# Patient Record
Sex: Female | Born: 1980 | Race: White | Hispanic: No | Marital: Married | Smoking: Never smoker
Health system: Southern US, Community
[De-identification: ages and names within clinical notes are randomized; demographics above are authoritative.]

## PROBLEM LIST (undated history)

## (undated) DIAGNOSIS — R51 Headache: Secondary | ICD-10-CM

## (undated) DIAGNOSIS — K219 Gastro-esophageal reflux disease without esophagitis: Secondary | ICD-10-CM

## (undated) DIAGNOSIS — D649 Anemia, unspecified: Secondary | ICD-10-CM

## (undated) DIAGNOSIS — R519 Headache, unspecified: Secondary | ICD-10-CM

## (undated) HISTORY — PX: NO PAST SURGERIES: SHX2092

---
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
## 2003-08-05 ENCOUNTER — Other Ambulatory Visit: Admission: RE | Admit: 2003-08-05 | Discharge: 2003-08-05 | Payer: Self-pay | Admitting: Obstetrics and Gynecology

## 2003-09-24 ENCOUNTER — Ambulatory Visit (HOSPITAL_COMMUNITY): Admission: RE | Admit: 2003-09-24 | Discharge: 2003-09-24 | Payer: Self-pay | Admitting: Obstetrics and Gynecology

## 2003-09-24 ENCOUNTER — Encounter: Payer: Self-pay | Admitting: Obstetrics and Gynecology

## 2003-12-04 ENCOUNTER — Inpatient Hospital Stay (HOSPITAL_COMMUNITY): Admission: AD | Admit: 2003-12-04 | Discharge: 2003-12-05 | Payer: Self-pay | Admitting: Obstetrics and Gynecology

## 2004-02-09 ENCOUNTER — Inpatient Hospital Stay (HOSPITAL_COMMUNITY): Admission: AD | Admit: 2004-02-09 | Discharge: 2004-02-09 | Payer: Self-pay | Admitting: Obstetrics and Gynecology

## 2004-02-18 ENCOUNTER — Inpatient Hospital Stay (HOSPITAL_COMMUNITY): Admission: AD | Admit: 2004-02-18 | Discharge: 2004-02-21 | Payer: Self-pay | Admitting: Obstetrics and Gynecology

## 2004-11-27 ENCOUNTER — Emergency Department: Payer: Self-pay | Admitting: Emergency Medicine

## 2005-01-07 ENCOUNTER — Other Ambulatory Visit: Admission: RE | Admit: 2005-01-07 | Discharge: 2005-01-07 | Payer: Self-pay | Admitting: Obstetrics and Gynecology

## 2006-04-26 ENCOUNTER — Other Ambulatory Visit: Admission: RE | Admit: 2006-04-26 | Discharge: 2006-04-26 | Payer: Self-pay | Admitting: Obstetrics and Gynecology

## 2007-01-25 ENCOUNTER — Emergency Department: Payer: Self-pay

## 2007-01-25 ENCOUNTER — Other Ambulatory Visit: Payer: Self-pay

## 2008-03-27 ENCOUNTER — Inpatient Hospital Stay: Payer: Self-pay | Admitting: Unknown Physician Specialty

## 2009-01-22 ENCOUNTER — Inpatient Hospital Stay: Payer: Self-pay | Admitting: Obstetrics and Gynecology

## 2010-03-08 ENCOUNTER — Ambulatory Visit: Payer: Self-pay | Admitting: Internal Medicine

## 2011-10-26 ENCOUNTER — Emergency Department: Payer: Self-pay | Admitting: *Deleted

## 2011-11-07 ENCOUNTER — Ambulatory Visit: Payer: Self-pay

## 2012-11-19 ENCOUNTER — Emergency Department: Payer: Self-pay | Admitting: Internal Medicine

## 2012-11-20 ENCOUNTER — Emergency Department: Payer: Self-pay | Admitting: Emergency Medicine

## 2012-11-20 LAB — BASIC METABOLIC PANEL
Anion Gap: 8 (ref 7–16)
BUN: 5 mg/dL — ABNORMAL LOW (ref 7–18)
Calcium, Total: 8.3 mg/dL — ABNORMAL LOW (ref 8.5–10.1)
Chloride: 107 mmol/L (ref 98–107)
Co2: 24 mmol/L (ref 21–32)
Creatinine: 0.59 mg/dL — ABNORMAL LOW (ref 0.60–1.30)
EGFR (African American): >60
EGFR (Non-African Amer.): >60
Glucose: 109 mg/dL — ABNORMAL HIGH (ref 65–99)
Osmolality: 275 (ref 275–301)
Potassium: 3.2 mmol/L — ABNORMAL LOW (ref 3.5–5.1)
Sodium: 139 mmol/L (ref 136–145)

## 2012-11-20 LAB — CBC
HCT: 33.2 % — ABNORMAL LOW (ref 35.0–47.0)
HGB: 11.5 g/dL — ABNORMAL LOW (ref 12.0–16.0)
MCH: 29.8 pg (ref 26.0–34.0)
MCHC: 34.6 g/dL (ref 32.0–36.0)
MCV: 86 fL (ref 80–100)
Platelet: 257 10*3/uL (ref 150–440)
RBC: 3.85 10*6/uL (ref 3.80–5.20)
RDW: 14.5 % (ref 11.5–14.5)
WBC: 12.7 10*3/uL — ABNORMAL HIGH (ref 3.6–11.0)

## 2015-05-23 ENCOUNTER — Emergency Department
Admission: EM | Admit: 2015-05-23 | Discharge: 2015-05-24 | Disposition: A | Discharge status: Home / Self Care | Payer: BC Managed Care – PPO | Attending: Student | Admitting: Student

## 2015-05-23 ENCOUNTER — Emergency Department: Payer: BC Managed Care – PPO

## 2015-05-23 ENCOUNTER — Encounter: Payer: Self-pay | Admitting: Emergency Medicine

## 2015-05-23 DIAGNOSIS — R1013 Epigastric pain: Secondary | ICD-10-CM | POA: Diagnosis not present

## 2015-05-23 DIAGNOSIS — R0602 Shortness of breath: Secondary | ICD-10-CM | POA: Insufficient documentation

## 2015-05-23 DIAGNOSIS — Z880 Allergy status to penicillin: Secondary | ICD-10-CM | POA: Insufficient documentation

## 2015-05-23 DIAGNOSIS — R079 Chest pain, unspecified: Secondary | ICD-10-CM | POA: Insufficient documentation

## 2015-05-23 DIAGNOSIS — Z3202 Encounter for pregnancy test, result negative: Secondary | ICD-10-CM | POA: Insufficient documentation

## 2015-05-23 LAB — BASIC METABOLIC PANEL
ANION GAP: 8 (ref 5–15)
BUN: 8 mg/dL (ref 6–20)
CALCIUM: 8.9 mg/dL (ref 8.9–10.3)
CO2: 23 mmol/L (ref 22–32)
Chloride: 106 mmol/L (ref 101–111)
Creatinine, Ser: 0.69 mg/dL (ref 0.44–1.00)
GFR calc non Af Amer: >60 mL/min (ref >60)
Glucose, Bld: 100 mg/dL — ABNORMAL HIGH (ref 65–99)
POTASSIUM: 3.9 mmol/L (ref 3.5–5.1)
Sodium: 137 mmol/L (ref 135–145)

## 2015-05-23 LAB — CBC
HCT: 35.1 % (ref 35.0–47.0)
HEMOGLOBIN: 12.2 g/dL (ref 12.0–16.0)
MCH: 29.9 pg (ref 26.0–34.0)
MCHC: 34.6 g/dL (ref 32.0–36.0)
MCV: 86.3 fL (ref 80.0–100.0)
PLATELETS: 254 10*3/uL (ref 150–440)
RBC: 4.07 MIL/uL (ref 3.80–5.20)
RDW: 14.3 % (ref 11.5–14.5)
WBC: 8.3 10*3/uL (ref 3.6–11.0)

## 2015-05-23 LAB — TROPONIN I: Troponin I: <0.03 ng/mL (ref <0.031)

## 2015-05-23 LAB — BRAIN NATRIURETIC PEPTIDE: B Natriuretic Peptide: 17 pg/mL (ref 0.0–100.0)

## 2015-05-23 MED ORDER — MORPHINE SULFATE 4 MG/ML IJ SOLN: 4.0000 mg | Freq: Once | INTRAMUSCULAR | Status: DC

## 2015-05-23 MED ORDER — GI COCKTAIL ~~LOC~~
30.0000 mL | Freq: Once | ORAL | Status: AC
  Administered 2015-05-23: 30 mL via ORAL

## 2015-05-23 MED ORDER — ONDANSETRON HCL 4 MG/2ML IJ SOLN
4.0000 mg | INTRAMUSCULAR | Status: AC
  Administered 2015-05-23: 4 mg via INTRAVENOUS

## 2015-05-23 MED ORDER — MORPHINE SULFATE 2 MG/ML IJ SOLN
INTRAMUSCULAR | Status: AC
  Administered 2015-05-23: 4 mg via INTRAVENOUS
  Filled 2015-05-23: qty 2

## 2015-05-23 MED ORDER — ONDANSETRON HCL 4 MG/2ML IJ SOLN
INTRAMUSCULAR | Status: AC
  Administered 2015-05-23: 4 mg via INTRAVENOUS
  Filled 2015-05-23: qty 2

## 2015-05-23 MED ORDER — GI COCKTAIL ~~LOC~~
ORAL | Status: AC
  Administered 2015-05-23: 30 mL via ORAL
  Filled 2015-05-23: qty 30

## 2015-05-23 NOTE — ED Provider Notes (Signed)
[facility] Emergency Department Provider Note  ____________________________________________  Time seen: Approximately 11:58 PM  I have reviewed the triage vital signs and the nursing notes.   HISTORY  Chief Complaint Chest Pain    HPI Katelyn Ellis is a 34 y.o. female with no chronic medical problems presents for evaluation of 4 days of gradual onset intermittent epigastric pain radiating into the mid chest. Today the pain has been constant since 2:30 PM and has been radiating to the back. When the pain was severe, it was associated with shortness of breath and it was worse with deep inspiration. Pain is not worsened with exertion. Currently pain is 6 out of 10 after morphine. She has had no nausea, no vomiting, no diarrhea, no fevers, no chills. She has a copper IUD and denies pregnancy, no hormone use, no history of DVT, no hemoptysis. She has been taking Nexium since yesterday however this has not seemed to help her pain.   History reviewed. No pertinent past medical history.  There are no active problems to display for this patient.   History reviewed. No pertinent past surgical history.  No current outpatient prescriptions on file.  Allergies Benadryl; Codeine; and Penicillins  No family history on file.  Social History History  Substance Use Topics  . Smoking status: Never Smoker   . Smokeless tobacco: Not on file  . Alcohol Use: No    Review of Systems Constitutional: No fever/chills Eyes: No visual changes. ENT: No sore throat. Cardiovascular: + chest pain. Respiratory: + shortness of breath. Gastrointestinal: +epigastric abdominal pain.  No nausea, no vomiting.  No diarrhea.  No constipation. Genitourinary: Negative for dysuria. Musculoskeletal: Negative for back pain. Skin: Negative for rash. Neurological: Negative for headaches, focal weakness or numbness.  10-point ROS otherwise  negative.  ____________________________________________   PHYSICAL EXAM:  VITAL SIGNS: ED Triage Vitals  Enc Vitals Group     BP 05/23/15 1746 119/70 mmHg     Pulse Rate 05/23/15 1746 71     Resp 05/23/15 1746 18     Temp 05/23/15 1746 98.5 F (36.9 C)     Temp Source 05/23/15 1746 Oral     SpO2 05/23/15 1746 100 %     Weight 05/23/15 1741 150 lb (68.04 kg)     Height 05/23/15 1741 5\' 4"  (1.626 m)     Head Cir --      Peak Flow --      Pain Score 05/23/15 1741 10     Pain Loc --      Pain Edu? --      Excl. in GC? --     Constitutional: Alert and oriented. Well appearing and in no acute distress. Eyes: Conjunctivae are normal. PERRL. EOMI. Head: Atraumatic. Nose: No congestion/rhinnorhea. Mouth/Throat: Mucous membranes are moist.  Oropharynx non-erythematous. Neck: No stridor. Cardiovascular: Normal rate, regular rhythm. Grossly normal heart sounds.  Good peripheral circulation. Respiratory: Normal respiratory effort.  No retractions. Lungs CTAB. Gastrointestinal: Soft and nontender. No distention. No abdominal bruits. No CVA tenderness. Genitourinary: deferred Musculoskeletal: No lower extremity tenderness nor edema.  No joint effusions. Neurologic:  Normal speech and language. No gross focal neurologic deficits are appreciated. Speech is normal. No gait instability. Skin:  Skin is warm, dry and intact. No rash noted. Psychiatric: Mood and affect are normal. Speech and behavior are normal.  ____________________________________________   LABS (all labs ordered are listed, but only abnormal results are displayed)  Labs Reviewed  BASIC METABOLIC PANEL -  Abnormal; Notable for the following:    Glucose, Bld 100 (*)    All other components within normal limits  CBC  BRAIN NATRIURETIC PEPTIDE  TROPONIN I  HEPATIC FUNCTION PANEL  URINALYSIS COMPLETEWITH MICROSCOPIC ([facility] ONLY)  POC URINE PREG, ED   ____________________________________________  EKG  ED ECG  REPORT I, Gayla Doss, the attending physician, personally viewed and interpreted this ECG.   Date: 05/24/2015  EKG Time: 17:45  Rate: 79  Rhythm: normal sinus rhythm, sinus rhythm with short PR interval  Axis: normal  Intervals:none  ST&T Change: No acute ST segment elevation  ____________________________________________  RADIOLOGY  CXR IMPRESSION: No active cardiopulmonary disease. ____________________________________________   PROCEDURES  Procedure(s) performed: None  Critical Care performed: No  ____________________________________________   INITIAL IMPRESSION / ASSESSMENT AND PLAN / ED COURSE  Pertinent labs & imaging results that were available during my care of the patient were reviewed by me and considered in my medical decision making (see chart for details).  Katelyn Ellis is a 34 y.o. female with no chronic medical problems presents for evaluation of 4 days of gradual onset intermittent epigastric pain radiating into the mid chest. On exam, she is very well-appearing and in no acute distress. Vital signs stable, she is afebrile. EKG is reassuring, no evidence of ischemia, troponin negative despite 10 hours of constant pain, doubt ACS. Heart score 1 because of family history of coronary artery disease beginning at age 28. PERC negative and I doubt PE. Not consistent with acute aortic dissection. Suspect GI cause of chest pain such as GERD or gastritis.  ----------------------------------------- 1:31 AM on 05/24/2015 -----------------------------------------  Patient reports significant improvement in pain after GI cocktail which supports possible GERD versus gastritis diagnosis. Labs generally unremarkable. UA with 2+ leuk esterase but no red blood cells, no white blood cells and rare bacteria, nitrite negative. The patient adamantly denies any urinary complaints so will not treat at this time. Urine cultures sent and are pending. Discussed omeprazole and  PCP follow-up. DC with return precautions. ____________________________________________   FINAL CLINICAL IMPRESSION(S) / ED DIAGNOSES  Final diagnoses:  Chest pain, unspecified chest pain type      Gayla Doss, MD 05/24/15 0134

## 2015-05-23 NOTE — ED Notes (Signed)
Epigastric pain that started Monday, radiates to back, progressively worse.  Thought it was indigestion at first, tried Nexium, no relief.  Now pain is worse, hurts to take a breath.  Is currently on an IUD.  No long travel recently.

## 2015-05-23 NOTE — ED Notes (Signed)
Patient reports pain to chest radiating up and down through sternal area and through to back that started 2 days ago. Patient reports that she has been taking Nexium but that this seems to be making pain worse. Patient sitting on side of bed at this time reports pain is severe.

## 2015-05-24 LAB — HEPATIC FUNCTION PANEL
ALT: 10 U/L — ABNORMAL LOW (ref 14–54)
AST: 17 U/L (ref 15–41)
Albumin: 4.1 g/dL (ref 3.5–5.0)
Alkaline Phosphatase: 95 U/L (ref 38–126)
BILIRUBIN TOTAL: 0.4 mg/dL (ref 0.3–1.2)
Bilirubin, Direct: <0.1 mg/dL — ABNORMAL LOW (ref 0.1–0.5)
Total Protein: 7.8 g/dL (ref 6.5–8.1)

## 2015-05-24 LAB — URINALYSIS COMPLETE WITH MICROSCOPIC (ARMC ONLY)
Bilirubin Urine: NEGATIVE
Glucose, UA: NEGATIVE mg/dL
KETONES UR: NEGATIVE mg/dL
Nitrite: NEGATIVE
PH: 6 (ref 5.0–8.0)
Protein, ur: NEGATIVE mg/dL
SPECIFIC GRAVITY, URINE: 1.004 — AB (ref 1.005–1.030)

## 2015-05-24 LAB — POCT PREGNANCY, URINE: PREG TEST UR: NEGATIVE

## 2015-05-24 MED ORDER — OMEPRAZOLE MAGNESIUM 20 MG PO TBEC: 20.0000 mg | DELAYED_RELEASE_TABLET | Freq: Every day | ORAL | Status: DC

## 2015-05-24 NOTE — ED Notes (Signed)
Upreg resulted negative, POC scanner wouldn't recognize pt's MRN

## 2015-05-25 LAB — URINE CULTURE: SPECIAL REQUESTS: NORMAL

## 2015-07-22 ENCOUNTER — Other Ambulatory Visit: Payer: Self-pay | Admitting: Nurse Practitioner

## 2015-07-22 DIAGNOSIS — R1013 Epigastric pain: Secondary | ICD-10-CM

## 2015-07-22 DIAGNOSIS — R110 Nausea: Secondary | ICD-10-CM

## 2015-07-28 ENCOUNTER — Other Ambulatory Visit: Payer: Self-pay | Admitting: Nurse Practitioner

## 2015-07-28 ENCOUNTER — Ambulatory Visit
Admission: RE | Admit: 2015-07-28 | Discharge: 2015-07-28 | Disposition: A | Discharge status: Home / Self Care | Payer: BC Managed Care – PPO | Source: Ambulatory Visit | Attending: Nurse Practitioner | Admitting: Nurse Practitioner

## 2015-07-28 DIAGNOSIS — K8020 Calculus of gallbladder without cholecystitis without obstruction: Secondary | ICD-10-CM | POA: Diagnosis not present

## 2015-07-28 DIAGNOSIS — R1013 Epigastric pain: Secondary | ICD-10-CM | POA: Insufficient documentation

## 2015-07-28 DIAGNOSIS — R110 Nausea: Secondary | ICD-10-CM

## 2015-07-28 MED ORDER — TECHNETIUM TC 99M MEBROFENIN IV KIT
5.6400 | PACK | Freq: Once | INTRAVENOUS | Status: DC | PRN
  Administered 2015-07-28: 5.64 via INTRAVENOUS
  Filled 2015-07-28: qty 6

## 2015-08-11 ENCOUNTER — Other Ambulatory Visit: Payer: BC Managed Care – PPO

## 2015-08-11 ENCOUNTER — Encounter: Payer: Self-pay | Admitting: *Deleted

## 2015-08-11 NOTE — Patient Instructions (Signed)
  Your procedure is scheduled on: 08-12-15 Report to [facility] SAME DAY SURGERY 2ND FLOOR @ 8:45-PT AWARE OF ARRIVAL TIME   Remember: Instructions that are not followed completely may result in serious medical risk, up to and including death, or upon the discretion of your surgeon and anesthesiologist your surgery may need to be rescheduled.    __X__ 1. Do not eat food or drink liquids after midnight. No gum chewing or hard candies.     __X__ 2. No Alcohol for 24 hours before or after surgery.   ____ 3. Bring all medications with you on the day of surgery if instructed.    ____ 4. Notify your doctor if there is any change in your medical condition     (cold, fever, infections).     Do not wear jewelry, make-up, hairpins, clips or nail polish.  Do not wear lotions, powders, or perfumes. You may wear deodorant.  Do not shave 48 hours prior to surgery. Men may shave face and neck.  Do not bring valuables to the hospital.    [facility] is not responsible for any belongings or valuables.               Contacts, dentures or bridgework may not be worn into surgery.  Leave your suitcase in the car. After surgery it may be brought to your room.  For patients admitted to the hospital, discharge time is determined by your  treatment team.   Patients discharged the day of surgery will not be allowed to drive home.   Please read over the following fact sheets that you were given:      ____ Take these medicines the morning of surgery with A SIP OF WATER:    1. TAKE AN EXTRA DOSE OF PRILOSEC TONIGHT  2. PT UNABLE TO SWALLOW PRILOSEC MORNING OF SURGERY WITH SIP OF WATER-PT HAS TO OPEN IT UP AN PUT IN APPLESAUCE-PT TOLD NOT TO DO THIS  3.   4.  5.  6.  ____ Fleet Enema (as directed)   ____ Use CHG Soap as directed  ____ Use inhalers on the day of surgery  ____ Stop metformin 2 days prior to surgery    ____ Take 1/2 of usual insulin dose the night before surgery and none on the  morning of surgery.   ____ Stop Coumadin/Plavix/aspirin-N/A  ____ Stop Anti-inflammatories-NO NSAIDS OR ASA PRODUCTS-TYLENOL OK   ____ Stop supplements until after surgery.    ____ Bring C-Pap to the hospital.

## 2015-08-12 ENCOUNTER — Ambulatory Visit
Admission: RE | Admit: 2015-08-12 | Discharge: 2015-08-12 | Disposition: A | Discharge status: Home / Self Care | Payer: BC Managed Care – PPO | Source: Ambulatory Visit | Attending: Surgery | Admitting: Surgery

## 2015-08-12 ENCOUNTER — Ambulatory Visit: Payer: BC Managed Care – PPO | Admitting: Anesthesiology

## 2015-08-12 ENCOUNTER — Ambulatory Visit: Payer: BC Managed Care – PPO

## 2015-08-12 ENCOUNTER — Encounter: Payer: Self-pay | Admitting: *Deleted

## 2015-08-12 ENCOUNTER — Encounter
Admission: RE | Disposition: A | Discharge status: Home / Self Care | Payer: Self-pay | Source: Ambulatory Visit | Attending: Surgery

## 2015-08-12 DIAGNOSIS — K219 Gastro-esophageal reflux disease without esophagitis: Secondary | ICD-10-CM | POA: Diagnosis not present

## 2015-08-12 DIAGNOSIS — K8010 Calculus of gallbladder with chronic cholecystitis without obstruction: Secondary | ICD-10-CM | POA: Diagnosis present

## 2015-08-12 DIAGNOSIS — Z79899 Other long term (current) drug therapy: Secondary | ICD-10-CM | POA: Insufficient documentation

## 2015-08-12 DIAGNOSIS — Z885 Allergy status to narcotic agent status: Secondary | ICD-10-CM | POA: Insufficient documentation

## 2015-08-12 DIAGNOSIS — Z880 Allergy status to penicillin: Secondary | ICD-10-CM | POA: Diagnosis not present

## 2015-08-12 DIAGNOSIS — K819 Cholecystitis, unspecified: Secondary | ICD-10-CM

## 2015-08-12 DIAGNOSIS — R1011 Right upper quadrant pain: Secondary | ICD-10-CM | POA: Insufficient documentation

## 2015-08-12 DIAGNOSIS — Z888 Allergy status to other drugs, medicaments and biological substances status: Secondary | ICD-10-CM | POA: Diagnosis not present

## 2015-08-12 HISTORY — DX: Gastro-esophageal reflux disease without esophagitis: K21.9

## 2015-08-12 HISTORY — PX: CHOLECYSTECTOMY: SHX55

## 2015-08-12 HISTORY — DX: Headache, unspecified: R51.9

## 2015-08-12 HISTORY — DX: Anemia, unspecified: D64.9

## 2015-08-12 HISTORY — DX: Headache: R51

## 2015-08-12 LAB — POCT PREGNANCY, URINE: Preg Test, Ur: NEGATIVE

## 2015-08-12 LAB — GLUCOSE, CAPILLARY: Glucose-Capillary: 79 mg/dL (ref 65–99)

## 2015-08-12 SURGERY — LAPAROSCOPIC CHOLECYSTECTOMY WITH INTRAOPERATIVE CHOLANGIOGRAM
Anesthesia: General | Wound class: Clean Contaminated

## 2015-08-12 MED ORDER — FENTANYL CITRATE (PF) 100 MCG/2ML IJ SOLN
INTRAMUSCULAR | Status: DC
Start: 2015-08-12 — End: 2015-08-12
  Filled 2015-08-12: qty 2

## 2015-08-12 MED ORDER — PROPOFOL 10 MG/ML IV BOLUS
INTRAVENOUS | Status: DC | PRN
  Administered 2015-08-12: 40 mg via INTRAVENOUS
  Administered 2015-08-12: 120 mg via INTRAVENOUS

## 2015-08-12 MED ORDER — SUGAMMADEX SODIUM 200 MG/2ML IV SOLN
INTRAVENOUS | Status: DC | PRN
  Administered 2015-08-12: 146 mg via INTRAVENOUS

## 2015-08-12 MED ORDER — ONDANSETRON HCL 4 MG/2ML IJ SOLN
INTRAMUSCULAR | Status: DC | PRN
  Administered 2015-08-12: 15:00:00 via INTRAVENOUS
  Administered 2015-08-12: 4 mg via INTRAVENOUS

## 2015-08-12 MED ORDER — SODIUM CHLORIDE 0.9 % IV SOLN
INTRAVENOUS | Status: DC | PRN
  Administered 2015-08-12: 10 mL

## 2015-08-12 MED ORDER — ROCURONIUM BROMIDE 100 MG/10ML IV SOLN
INTRAVENOUS | Status: DC | PRN
  Administered 2015-08-12 (×2): 5 mg via INTRAVENOUS
  Administered 2015-08-12: 40 mg via INTRAVENOUS

## 2015-08-12 MED ORDER — FENTANYL CITRATE (PF) 100 MCG/2ML IJ SOLN
INTRAMUSCULAR | Status: AC
  Filled 2015-08-12: qty 2

## 2015-08-12 MED ORDER — DEXAMETHASONE SODIUM PHOSPHATE 4 MG/ML IJ SOLN
INTRAMUSCULAR | Status: DC | PRN
  Administered 2015-08-12: 10 mg via INTRAVENOUS

## 2015-08-12 MED ORDER — SODIUM CHLORIDE 0.9 % IJ SOLN
INTRAMUSCULAR | Status: AC
  Filled 2015-08-12: qty 50

## 2015-08-12 MED ORDER — FENTANYL CITRATE (PF) 100 MCG/2ML IJ SOLN
25.0000 ug | INTRAMUSCULAR | Status: AC | PRN
  Administered 2015-08-12 (×6): 25 ug via INTRAVENOUS

## 2015-08-12 MED ORDER — LACTATED RINGERS IV SOLN
INTRAVENOUS | Status: DC
  Administered 2015-08-12 (×3): via INTRAVENOUS

## 2015-08-12 MED ORDER — MIDAZOLAM HCL 2 MG/2ML IJ SOLN
INTRAMUSCULAR | Status: DC | PRN
  Administered 2015-08-12: 2 mg via INTRAVENOUS

## 2015-08-12 MED ORDER — LIDOCAINE HCL (CARDIAC) 20 MG/ML IV SOLN
INTRAVENOUS | Status: DC | PRN
  Administered 2015-08-12: 60 mg via INTRAVENOUS

## 2015-08-12 MED ORDER — HEPARIN SODIUM (PORCINE) 5000 UNIT/ML IJ SOLN
INTRAMUSCULAR | Status: AC
  Filled 2015-08-12: qty 1

## 2015-08-12 MED ORDER — HYDROCODONE-ACETAMINOPHEN 5-325 MG PO TABS
ORAL_TABLET | ORAL | Status: DC
Start: 2015-08-12 — End: 2015-08-12
  Filled 2015-08-12: qty 1

## 2015-08-12 MED ORDER — HYDROCODONE-ACETAMINOPHEN 5-325 MG PO TABS
1.0000 | ORAL_TABLET | ORAL | Status: DC | PRN
  Administered 2015-08-12: 1 via ORAL

## 2015-08-12 MED ORDER — ONDANSETRON HCL 4 MG/2ML IJ SOLN
INTRAMUSCULAR | Status: AC
  Filled 2015-08-12: qty 2

## 2015-08-12 MED ORDER — FENTANYL CITRATE (PF) 100 MCG/2ML IJ SOLN
INTRAMUSCULAR | Status: DC | PRN
Start: 2015-08-12 — End: 2015-08-12
  Administered 2015-08-12: 50 ug via INTRAVENOUS
  Administered 2015-08-12: 100 ug via INTRAVENOUS
  Administered 2015-08-12 (×2): 50 ug via INTRAVENOUS

## 2015-08-12 MED ORDER — ROCURONIUM 10MG/ML (10ML) SYRINGE FOR MEDFUSION PUMP - OPTIME: INTRAVENOUS | Status: DC | PRN

## 2015-08-12 MED ORDER — FAMOTIDINE IN NACL 20-0.9 MG/50ML-% IV SOLN
20.0000 mg | Freq: Once | INTRAVENOUS | Status: AC
  Administered 2015-08-12: 20 mg via INTRAVENOUS
  Filled 2015-08-12: qty 50

## 2015-08-12 MED ORDER — HYDROCODONE-ACETAMINOPHEN 5-325 MG PO TABS: 1.0000 | ORAL_TABLET | ORAL | Status: DC | PRN

## 2015-08-12 MED ORDER — ONDANSETRON HCL 4 MG/2ML IJ SOLN: 4.0000 mg | Freq: Once | INTRAMUSCULAR | Status: DC | PRN

## 2015-08-12 SURGICAL SUPPLY — 41 items
APPLIER CLIP ROT 10 11.4 M/L (STAPLE) ×3
APR CLP MED LRG 11.4X10 (STAPLE) ×1
CANISTER SUCT 1200ML W/VALVE (MISCELLANEOUS) ×3 IMPLANT
CANNULA DILATOR 12 W/SLV (CANNULA) ×2 IMPLANT
CANNULA DILATOR 12MM W/SLV (CANNULA) ×1
CATH REDDICK CHOLANGI 4FR 50CM (CATHETERS) ×3 IMPLANT
CHLORAPREP W/TINT 26ML (MISCELLANEOUS) ×3 IMPLANT
CLIP APPLIE ROT 10 11.4 M/L (STAPLE) ×1 IMPLANT
CLOSURE WOUND 1/2 X4 (GAUZE/BANDAGES/DRESSINGS)
DRAPE SHEET LG 3/4 BI-LAMINATE (DRAPES) ×3 IMPLANT
GAUZE SPONGE 4X4 12PLY STRL (GAUZE/BANDAGES/DRESSINGS) ×1 IMPLANT
GLOVE BIO SURGEON STRL SZ7.5 (GLOVE) ×7 IMPLANT
GLOVE BIOGEL PI IND STRL 6.5 (GLOVE) IMPLANT
GLOVE BIOGEL PI INDICATOR 6.5 (GLOVE) ×2
GLOVE EXAM NITRILE PF MED BLUE (GLOVE) ×2 IMPLANT
GOWN STRL REUS W/ TWL LRG LVL3 (GOWN DISPOSABLE) ×4 IMPLANT
GOWN STRL REUS W/TWL LRG LVL3 (GOWN DISPOSABLE) ×9
IRRIGATION STRYKERFLOW (MISCELLANEOUS) ×1 IMPLANT
IRRIGATOR STRYKERFLOW (MISCELLANEOUS) ×3
IV NS 1000ML (IV SOLUTION) ×3
IV NS 1000ML BAXH (IV SOLUTION) ×1 IMPLANT
KIT RM TURNOVER STRD PROC AR (KITS) ×3 IMPLANT
LABEL OR SOLS (LABEL) ×3 IMPLANT
NDL FILTER BLUNT 18X1 1/2 (NEEDLE) ×1 IMPLANT
NEEDLE FILTER BLUNT 18X 1/2SAF (NEEDLE) ×2
NEEDLE FILTER BLUNT 18X1 1/2 (NEEDLE) ×1 IMPLANT
NS IRRIG 500ML POUR BTL (IV SOLUTION) ×3 IMPLANT
PACK LAP CHOLECYSTECTOMY (MISCELLANEOUS) ×3 IMPLANT
PAD GROUND ADULT SPLIT (MISCELLANEOUS) ×3 IMPLANT
SCISSORS METZENBAUM CVD 33 (INSTRUMENTS) ×3 IMPLANT
SEAL FOR SCOPE WARMER C3101 (MISCELLANEOUS) ×1 IMPLANT
SLEEVE ENDOPATH XCEL 5M (ENDOMECHANICALS) ×3 IMPLANT
STRIP CLOSURE SKIN 1/2X4 (GAUZE/BANDAGES/DRESSINGS) ×1 IMPLANT
SUT CHROMIC 5 0 RB 1 27 (SUTURE) ×3 IMPLANT
SUT MAXON ABS #0 GS21 30IN (SUTURE) ×2 IMPLANT
SUT VIC AB 0 CT2 27 (SUTURE) ×2 IMPLANT
SYR 3ML LL SCALE MARK (SYRINGE) ×3 IMPLANT
TROCAR XCEL NON-BLD 11X100MML (ENDOMECHANICALS) ×3 IMPLANT
TROCAR XCEL NON-BLD 5MMX100MML (ENDOMECHANICALS) ×3 IMPLANT
TUBING INSUFFLATOR HI FLOW (MISCELLANEOUS) ×3 IMPLANT
WATER STERILE IRR 1000ML POUR (IV SOLUTION) ×1 IMPLANT

## 2015-08-12 NOTE — Anesthesia Postprocedure Evaluation (Signed)
  Anesthesia Post-op Note  Patient: Katelyn Ellis  Procedure(s) Performed: Procedure(s): LAPAROSCOPIC CHOLECYSTECTOMY WITH INTRAOPERATIVE CHOLANGIOGRAM (N/A)  Anesthesia type:General  Patient location: PACU  Post pain: Pain level controlled  Post assessment: Post-op Vital signs reviewed, Patient's Cardiovascular Status Stable, Respiratory Function Stable, Patent Airway and No signs of Nausea or vomiting  Post vital signs: Reviewed and stable  Last Vitals:  Filed Vitals:   08/12/15 0848  BP: 135/72  Pulse: 64  Temp: 36.7 C  Resp: 14    Level of consciousness: awake, alert  and patient cooperative  Complications: No apparent anesthesia complications

## 2015-08-12 NOTE — H&P (Signed)
  She reports continuing right upper quadrant abdominal pains. I discussed her recent findings of gallstones.  She reports no other change in overall condition.  I discussed plan is for laparoscopic cholecystectomy.

## 2015-08-12 NOTE — Anesthesia Procedure Notes (Signed)
Procedure Name: Intubation Performed by: Evelena Peat Pre-anesthesia Checklist: Patient identified, Emergency Drugs available, Suction available, Patient being monitored and Timeout performed Patient Re-evaluated:Patient Re-evaluated prior to inductionOxygen Delivery Method: Circle system utilized Preoxygenation: Pre-oxygenation with 100% oxygen Intubation Type: IV induction Ventilation: Mask ventilation without difficulty Laryngoscope Size: 2 Grade View: Grade I Tube type: Oral Tube size: 7.0 mm Airway Equipment and Method: Stylet Placement Confirmation: ETT inserted through vocal cords under direct vision,  positive ETCO2 and breath sounds checked- equal and bilateral Secured at: 21 cm Tube secured with: Tape

## 2015-08-12 NOTE — Transfer of Care (Signed)
Immediate Anesthesia Transfer of Care Note  Patient: Katelyn Ellis  Procedure(s) Performed: Procedure(s): LAPAROSCOPIC CHOLECYSTECTOMY WITH INTRAOPERATIVE CHOLANGIOGRAM (N/A)  Patient Location: PACU  Anesthesia Type:General  Level of Consciousness: awake, alert  and oriented  Airway & Oxygen Therapy: Patient Spontanous Breathing and Patient connected to nasal cannula oxygen  Post-op Assessment: Report given to RN and Post -op Vital signs reviewed and stable  Post vital signs: Reviewed and stable  Last Vitals:  Filed Vitals:   08/12/15 0848  BP: 135/72  Pulse: 64  Temp: 36.7 C  Resp: 14    Complications: No apparent anesthesia complications 

## 2015-08-12 NOTE — Transfer of Care (Signed)
Immediate Anesthesia Transfer of Care Note  Patient: Katelyn Ellis  Procedure(s) Performed: Procedure(s): LAPAROSCOPIC CHOLECYSTECTOMY WITH INTRAOPERATIVE CHOLANGIOGRAM (N/A)  Patient Location: PACU  Anesthesia Type:General  Level of Consciousness: awake, alert  and oriented  Airway & Oxygen Therapy: Patient Spontanous Breathing and Patient connected to nasal cannula oxygen  Post-op Assessment: Report given to RN and Post -op Vital signs reviewed and stable  Post vital signs: Reviewed and stable  Last Vitals:  Filed Vitals:   08/12/15 0848  BP: 135/72  Pulse: 64  Temp: 36.7 C  Resp: 14    Complications: No apparent anesthesia complications

## 2015-08-12 NOTE — Anesthesia Preprocedure Evaluation (Signed)
Anesthesia Evaluation  Patient identified by MRN, date of birth, ID band Patient awake    Reviewed: Allergy & Precautions, NPO status , Patient's Chart, lab work & pertinent test results  Airway Mallampati: II       Dental no notable dental hx.    Pulmonary neg pulmonary ROS,    Pulmonary exam normal       Cardiovascular negative cardio ROS Normal cardiovascular exam    Neuro/Psych  Headaches, negative psych ROS   GI/Hepatic Neg liver ROS, GERD-  Medicated and Controlled,  Endo/Other  negative endocrine ROS  Renal/GU negative Renal ROS  negative genitourinary   Musculoskeletal negative musculoskeletal ROS (+)   Abdominal Normal abdominal exam  (+)   Peds negative pediatric ROS (+)  Hematology  (+) anemia ,   Anesthesia Other Findings   Reproductive/Obstetrics negative OB ROS                             Anesthesia Physical Anesthesia Plan  ASA: II  Anesthesia Plan: General   Post-op Pain Management:    Induction: Intravenous  Airway Management Planned: Oral ETT  Additional Equipment:   Intra-op Plan:   Post-operative Plan: Extubation in OR  Informed Consent:   Dental advisory given  Plan Discussed with: CRNA and Surgeon  Anesthesia Plan Comments:         Anesthesia Quick Evaluation

## 2015-08-12 NOTE — OR Nursing (Signed)
Dr Katrinka Blazing in to see pt. Instructed pt she could go back to work in 1 week. Also could crush medication and take in apple sauce.

## 2015-08-12 NOTE — Op Note (Signed)
OPERATIVE REPORT  PREOPERATIVE DIAGNOSIS:  Chronic cholecystitis cholelithiasis  POSTOPERATIVE DIAGNOSIS: Chronic cholecystitis cholelithiasis  PROCEDURE: Laparoscopic cholecystectomy with cholangiogram  ANESTHESIA: General  SURGEON: Renda Rolls M.D.  INDICATIONS: She has a history of right upper quadrant abdominal pains and ultrasound findings of gallstones. Surgery was recommended for definitive treatment.    With the patient on the operating table in the supine position under general endotracheal anesthesia the abdomen was prepared with ChloraPrep solution and draped in a sterile manner. A short incision was made in the inferior aspect of the umbilicus and carried down to the deep fascia which was grasped with a laryngeal hook. A Veress needle was inserted aspirated and irrigated with a saline solution. The peritoneal cavity was insufflated with carbon dioxide. The Veress needle was removed. The 10 mm  trocar was inserted. The 10 mm 0 laparoscope was inserted to view the peritoneal cavity  Another incision was made in the epigastrium slightly to the right of the midline to introduce an 11 mm cannula. 2 incisions were made in the lateral aspect of the right upper quadrant to introduce 2   5 mm cannulas. Initial inspection revealed the liver was smooth. There was some thickening of the gallbladder wall. There was a trace of ascites.  The gallbladder was retracted towards the right shoulder.   The gallbladder neck was retracted inferiorly and laterally.  The porta hepatis was identified. The gallbladder was mobilized with incision of the visceral peritoneum. The cystic duct was dissected free from surrounding structures. The cystic artery was dissected free from surrounding structures. A critical view of safety was demonstrated  An Endo Clip was placed across the cystic duct adjacent to the gallbladder neck. An incision was made in the cystic duct to introduce a Reddick catheter. The  cholangiogram was done with injection of half-strength Conray 60 dye. This demonstrated the bile ducts and flow of dye into the duodenum. No retained stones were identified. The bulb of the Reddick catheter appeared to be adjacent to the common bile duct. The bulb was more than a centimeter into the cystic duct. The cholangiogram appeared normal. The Reddick catheter was removed. The cystic duct was doubly ligated with endoclips and divided. The cystic artery was controlled with double endoclips and divided. The gallbladder was dissected free from the liver with use of hook and cautery and blunt dissection.. There was another bleeding point in the gallbladder bed which was controlled with hemoclips Bleeding was  minimal and hemostasis was subsequently intact. The gallbladder was delivered up through the infraumbilical incision opened and suctioned. There was thickening of the gallbladder wall and large stones within the gallbladder. It was necessary to lengthen the skin incision by 1 cm and also incised the deep fascia 1 cm. With additional traction and manipulation the gallbladder with multiple palpable large stones were removed and submitted in formalin for routine pathology. The right upper quadrant was further inspected and could see hemostasis was intact. The cannulas were removed allowing carbon dioxide to escape from the peritoneal cavity. The fascial defect at the umbilicus was closed with interrupted 0 Maxon figure-of-eight sutures. The skin incisions were closed with interrupted 5-0 chromic subcutaneous sutures benzoin and Steri-Strips. Gauze dressings were applied with paper tape.  The patient appeared to be in satisfactory condition and was prepared for transfer to the recovery room  Renda Rolls M.D.

## 2015-08-12 NOTE — Discharge Instructions (Addendum)
Take Tylenol or Norco if needed for pain. Take omeprazole for the first few days after surgery and then may discontinue as desired. Remove dressings on Wednesday. May shower Thursday. Avoid straining and heavy lifting.      AMBULATORY SURGERY  DISCHARGE INSTRUCTIONS   1) The drugs that you were given will stay in your system until tomorrow so for the next 24 hours you should not:  A) Drive an automobile B) Make any legal decisions C) Drink any alcoholic beverage   2) You may resume regular meals tomorrow.  Today it is better to start with liquids and gradually work up to solid foods.  You may eat anything you prefer, but it is better to start with liquids, then soup and crackers, and gradually work up to solid foods.   3) Please notify your doctor immediately if you have any unusual bleeding, trouble breathing, redness and pain at the surgery site, drainage, fever, or pain not relieved by medication. 4)   5) Your post-operative visit with Dr.                                     is: Date:                        Time:    Please call to schedule your post-operative visit.  6) Additional Instructions:     Laparoscopic Cholecystectomy, Care After These instructions give you information on caring for yourself after your procedure. Your doctor may also give you more specific instructions. Call your doctor if you have any problems or questions after your procedure.  HOME CARE  Change your bandages (dressings) as told by your doctor.  Keep the wound dry and clean. Wash the wound gently with soap and water. Pat the wound dry with a clean towel.  Do not take baths, swim, or use hot tubs for 2 weeks, or as told by your doctor.  Only take medicine as told by your doctor.  Eat a normal diet as told by your doctor.  Do not lift anything heavier than 10 pounds (4.5 kg) until your doctor says it is okay.  Do not play contact sports for 1 week, or as told by your doctor. GET  HELP IF:  Your wound is red, puffy (swollen), or painful.  You have yellowish-white fluid (pus) coming from the wound.  You have fluid draining from the wound for more than 1 day.  You have a bad smell coming from the wound.  Your wound breaks open. GET HELP RIGHT AWAY IF:   You have a rash.  You have trouble breathing.  You have chest pain.  You have a fever.  You have pain in the shoulders (shoulder strap areas) that is getting worse.  You feel dizzy or pass out (faint).  You have severe belly (abdominal) pain.  You feel sick to your stomach (nauseous) or throw up (vomit) for more than 1 day. Document Released: 09/07/2008 Document Revised: 09/19/2013 Document Reviewed: 07/11/2013 [facility] Patient Information 2015 [place], [place]. This information is not intended to replace advice given to you by your health care provider. Make sure you discuss any questions you have with your health care provider.  Be sure and use another form of birth control for 7 days after surgery. Medication you were given in the or could make your birth control less effective.

## 2015-08-13 ENCOUNTER — Encounter: Payer: Self-pay | Admitting: Surgery

## 2015-08-13 LAB — SURGICAL PATHOLOGY

## 2016-03-04 IMAGING — US US ABDOMEN COMPLETE
1 series · 14 of 25 positions shown · non-contrast
Comparison: None in PACs

CLINICAL DATA: Nausea and epigastric pain, family history of
pancreatic malignancy.

EXAM:
ULTRASOUND ABDOMEN COMPLETE

[Series 1: us abdomen complete · 0.25mm/px · 14 of 97 slices shown]
[im 1/97]
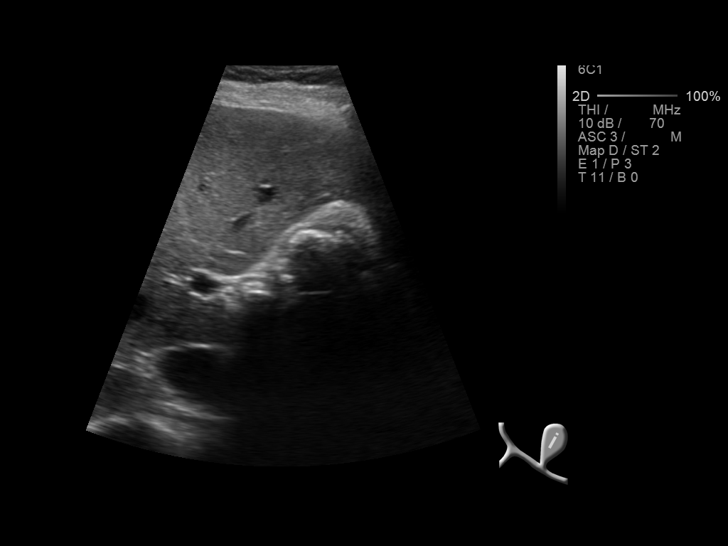
[im 9/97]
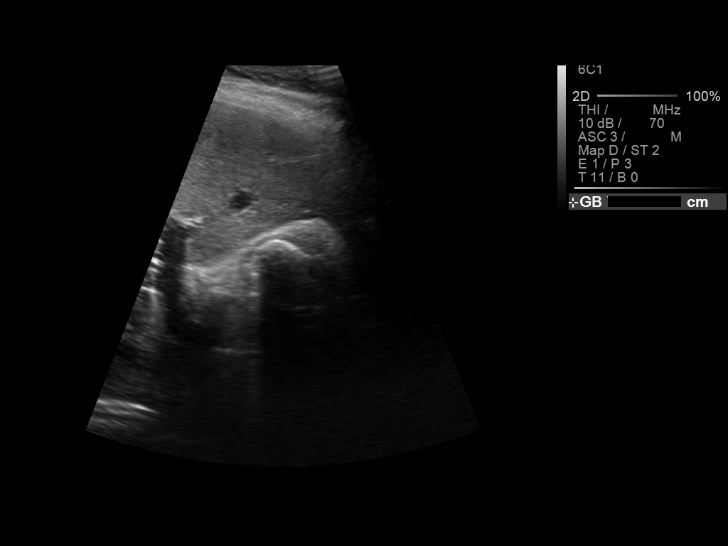
[im 17/97]
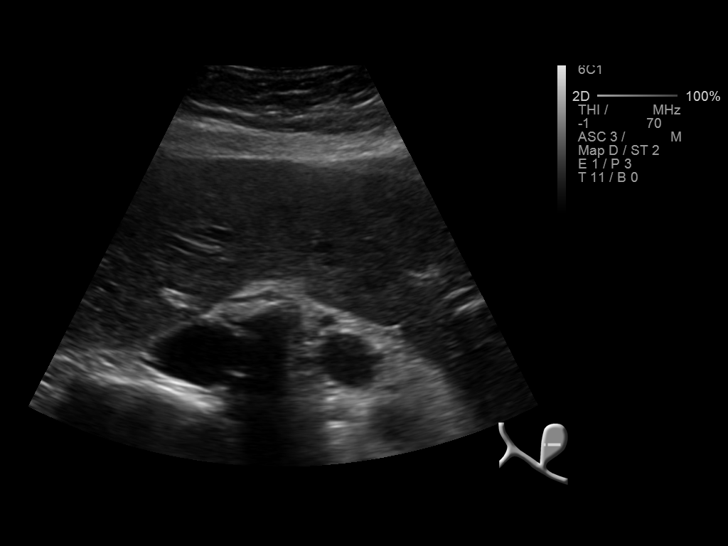
[im 25/97]
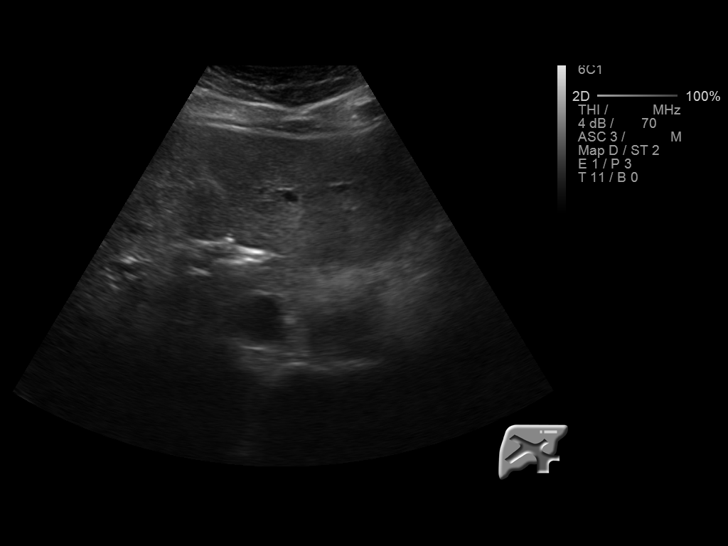
[im 33/97]
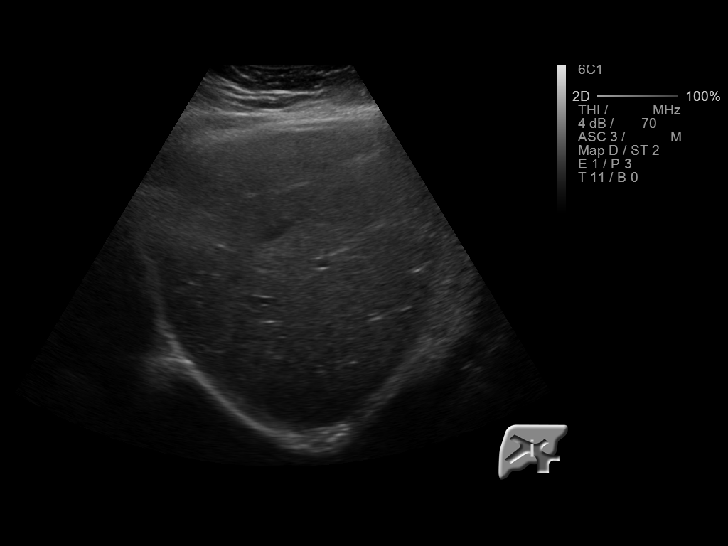
[im 37/97]
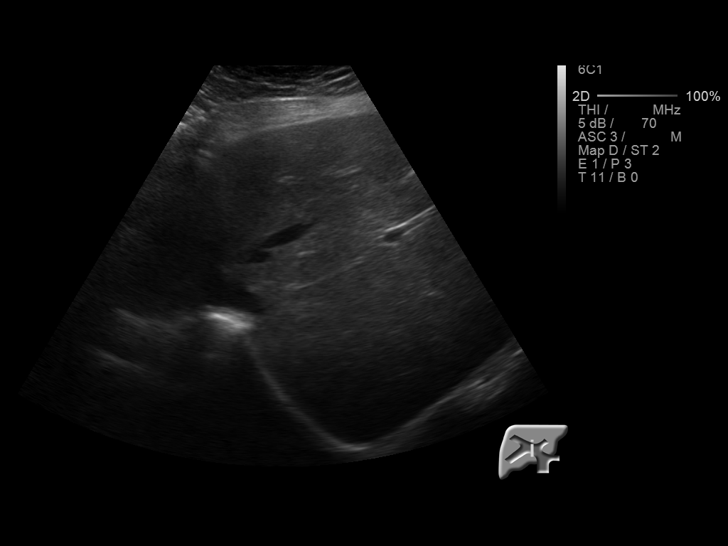
[im 45/97]
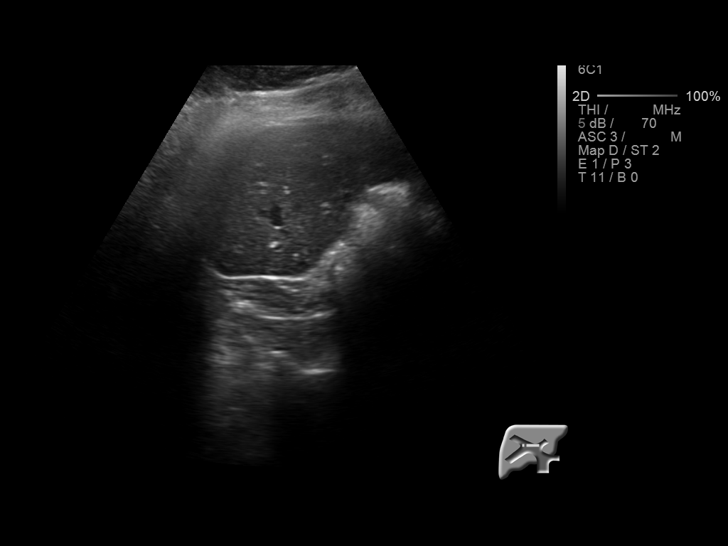
[im 53/97]
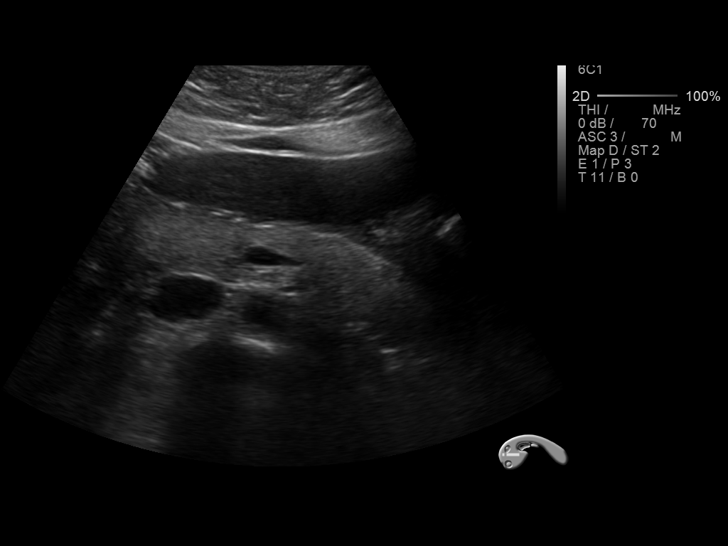
[im 61/97]
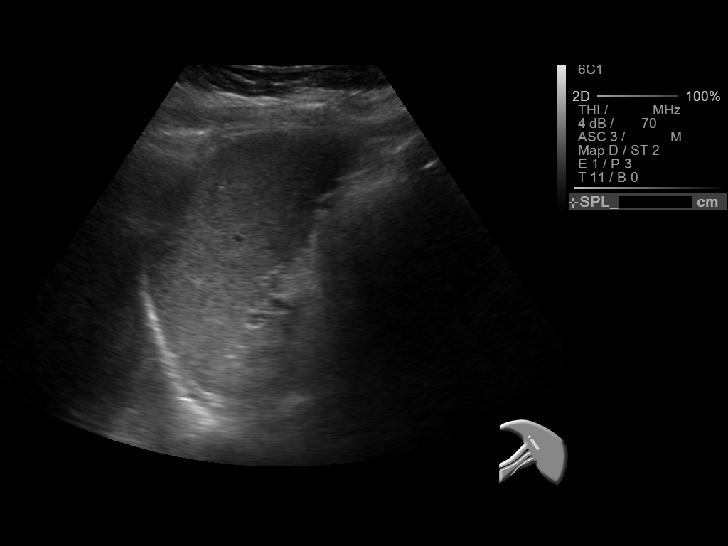
[im 65/97]
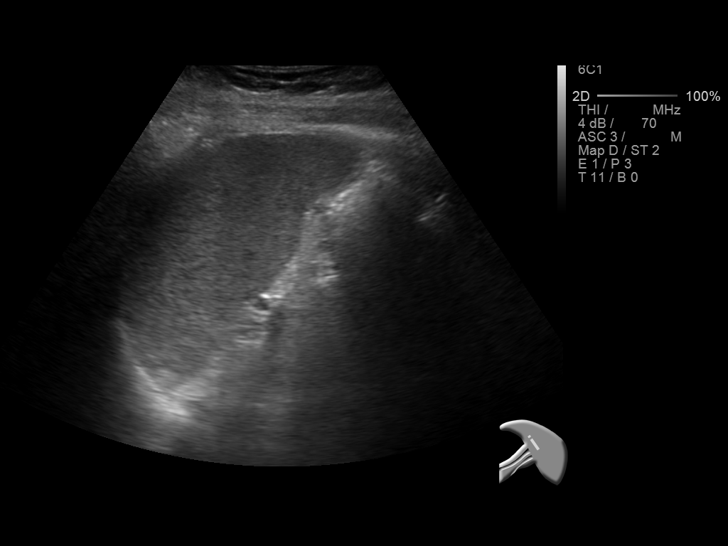
[im 73/97]
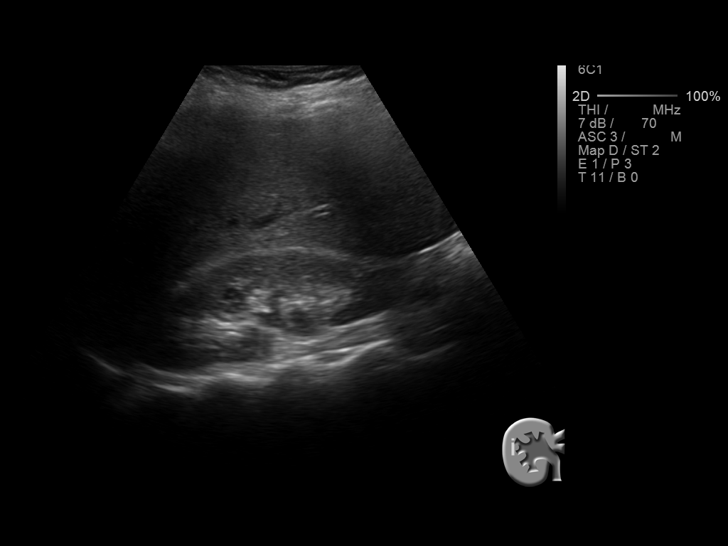
[im 81/97]
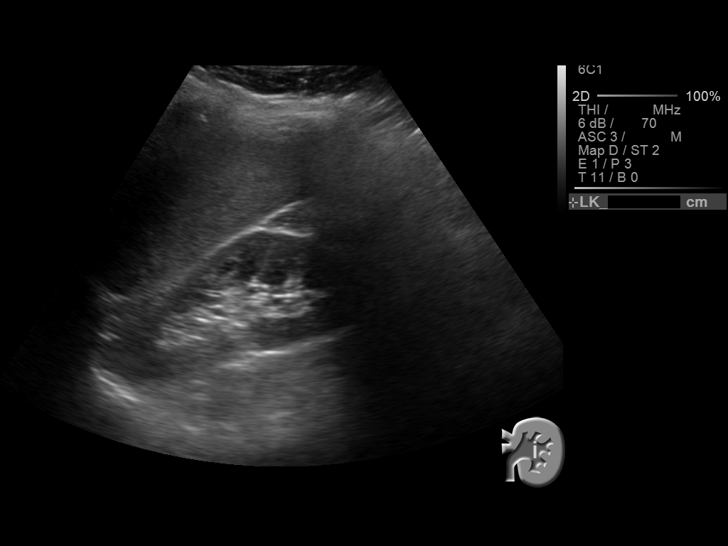
[im 89/97]
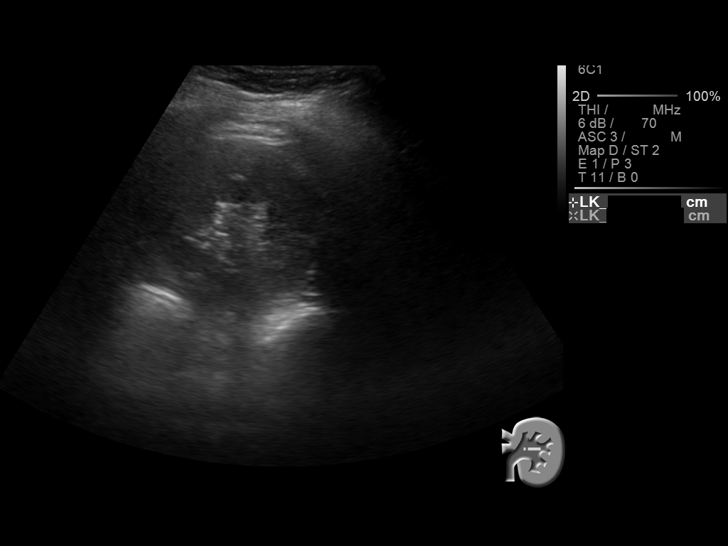
[im 97/97]
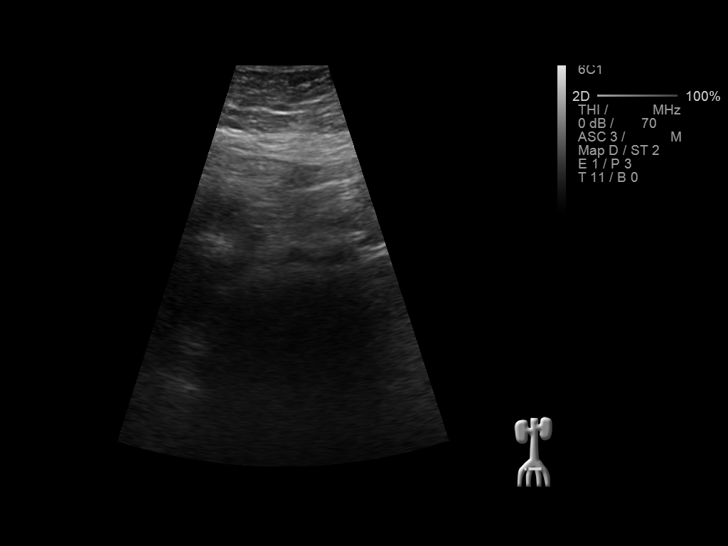

[14 of 25 positions shown; findings below may reference images not displayed]

FINDINGS: Gallbladder: The gallbladder is contracted. There is a wall echo
shadow contour consistent with the presence of multiple stones. The
gallbladder wall measures approximately 4 mm. There is no
pericholecystic fluid or positive sonographic Murphy's sign.

Common bile duct: Diameter: 4.2 mm

Liver: No focal lesion identified. Within normal limits in
parenchymal echogenicity.

IVC: No abnormality visualized.

Pancreas: Visualized portion unremarkable.

Spleen: Size and appearance within normal limits.

Right Kidney: Length: 11.1 cm. Echogenicity within normal limits. No
mass or hydronephrosis visualized.

Left Kidney: Length: 11.4 cm. Echogenicity within normal limits. No
mass or hydronephrosis visualized.

Abdominal aorta: No aneurysm visualized.

Other findings: There is no ascites
IMPRESSION: 1. Contracted gallbladder with multiple stones. There is no
sonographic evidence of acute cholecystitis.
2. The remainder the examination is unremarkable.

## 2017-01-02 ENCOUNTER — Encounter: Payer: Self-pay | Admitting: Emergency Medicine

## 2017-01-02 ENCOUNTER — Emergency Department
Admission: EM | Admit: 2017-01-02 | Discharge: 2017-01-02 | Disposition: A | Discharge status: Home / Self Care | Payer: BC Managed Care – PPO | Attending: Student in an Organized Health Care Education/Training Program | Admitting: Student in an Organized Health Care Education/Training Program

## 2017-01-02 DIAGNOSIS — R51 Headache: Secondary | ICD-10-CM | POA: Diagnosis present

## 2017-01-02 DIAGNOSIS — Z79899 Other long term (current) drug therapy: Secondary | ICD-10-CM | POA: Insufficient documentation

## 2017-01-02 DIAGNOSIS — J111 Influenza due to unidentified influenza virus with other respiratory manifestations: Secondary | ICD-10-CM

## 2017-01-02 MED ORDER — OSELTAMIVIR PHOSPHATE 75 MG PO CAPS: 75.0000 mg | ORAL_CAPSULE | Freq: Two times a day (BID) | ORAL | 0 refills | Status: AC

## 2017-01-02 MED ORDER — BENZONATATE 100 MG PO CAPS: 100.0000 mg | ORAL_CAPSULE | Freq: Three times a day (TID) | ORAL | 0 refills | Status: AC | PRN

## 2017-01-02 NOTE — ED Notes (Signed)

## 2017-01-02 NOTE — ED Provider Notes (Signed)
[facility] Emergency Department Provider Note  ____________________________________________  Time seen: Approximately 1:06 PM  I have reviewed the triage vital signs and the nursing notes.   HISTORY  Chief Complaint Influenza (exposed to influenza - husband dx friday)    HPI Katelyn Ellis is a 36 y.o. female presents to emergency department with one day of headache, non productive cough, congestion, chills, and hot flashes.States she vomited once yesterday. Patient states she's had 2 episodes of diarrhea. Patient is able to eat and drink without vomiting. Patient's husband was diagnosed with influenza on Friday. She did not receive the flu shot this year.   Past Medical History:  Diagnosis Date  . Anemia   . GERD (gastroesophageal reflux disease)   . Headache     There are no active problems to display for this patient.   Past Surgical History:  Procedure Laterality Date  . CHOLECYSTECTOMY N/A 08/12/2015   Procedure: LAPAROSCOPIC CHOLECYSTECTOMY WITH INTRAOPERATIVE CHOLANGIOGRAM;  Surgeon: Nadeen LandauJarvis Wilton Smith, MD;  Location: [facility] ORS;  Service: General;  Laterality: N/A;  . NO PAST SURGERIES      Prior to Admission medications   Medication Sig Start Date End Date Taking? Authorizing Provider  benzonatate (TESSALON PERLES) 100 MG capsule Take 1 capsule (100 mg total) by mouth 3 (three) times daily as needed for cough. 01/02/17 01/02/18  Enid DerryAshley Adriyanna Christians, PA-C  HYDROcodone-acetaminophen (NORCO) 5-325 MG per tablet Take 1-2 tablets by mouth every 4 (four) hours as needed for moderate pain. 08/12/15   Nadeen LandauJarvis Wilton Smith, MD  omeprazole (PRILOSEC OTC) 20 MG tablet Take 1 tablet (20 mg total) by mouth daily. Patient taking differently: Take 20 mg by mouth every morning.  05/24/15 05/23/16  Gayla DossEryka A Gayle, MD  oseltamivir (TAMIFLU) 75 MG capsule Take 1 capsule (75 mg total) by mouth 2 (two) times daily. 01/02/17 01/12/17  Enid DerryAshley Nimsi Males, PA-C     Allergies Benadryl [diphenhydramine hcl (sleep)]; Codeine; Penicillins; and Zithromax [azithromycin]  History reviewed. No pertinent family history.  Social History Social History  Substance Use Topics  . Smoking status: Never Smoker  . Smokeless tobacco: Never Used  . Alcohol use No     Review of Systems  Constitutional: No fever Eyes: No visual changes. No discharge. ENT: Positive for congestion and rhinorrhea. Cardiovascular: No chest pain. Respiratory: Positive for cough. No SOB. Gastrointestinal: No abdominal pain.  No constipation. Musculoskeletal: Negative for musculoskeletal pain. Skin: Negative for rash, abrasions, lacerations, ecchymosis. Neurological: Negative for headaches.   ____________________________________________   PHYSICAL EXAM:  VITAL SIGNS: ED Triage Vitals  Enc Vitals Group     BP 01/02/17 1131 116/75     Pulse Rate 01/02/17 1131 63     Resp 01/02/17 1131 18     Temp 01/02/17 1131 98.5 F (36.9 C)     Temp Source 01/02/17 1131 Oral     SpO2 01/02/17 1131 100 %     Weight 01/02/17 1131 150 lb (68 kg)     Height 01/02/17 1131 5\' 4"  (1.626 m)     Head Circumference --      Peak Flow --      Pain Score 01/02/17 1149 4     Pain Loc --      Pain Edu? --      Excl. in GC? --      Constitutional: Alert and oriented. Well appearing and in no acute distress. Eyes: Conjunctivae are normal. PERRL. EOMI. No discharge. Head: Atraumatic. ENT: No frontal and maxillary sinus  tenderness.      Ears: Tympanic membranes pearly gray with good landmarks. No discharge.      Nose: Mild congestion/rhinnorhea.      Mouth/Throat: Mucous membranes are moist. Oropharynx non-erythematous. Tonsils not enlarged. No exudates. Uvula midline. Neck: No stridor.   Hematological/Lymphatic/Immunilogical: No cervical lymphadenopathy. Cardiovascular: Normal rate, regular rhythm. Normal S1 and S2.  Good peripheral circulation. Respiratory: Normal respiratory effort  without tachypnea or retractions. Lungs CTAB. Good air entry to the bases with no decreased or absent breath sounds. Gastrointestinal: Bowel sounds 4 quadrants. Soft and nontender to palpation. No guarding or rigidity. No palpable masses. No distention. Musculoskeletal: Full range of motion to all extremities. No gross deformities appreciated. Neurologic:  Normal speech and language. No gross focal neurologic deficits are appreciated.  Skin:  Skin is warm, dry and intact. No rash noted. Psychiatric: Mood and affect are normal. Speech and behavior are normal. Patient exhibits appropriate insight and judgement.   ____________________________________________   LABS (all labs ordered are listed, but only abnormal results are displayed)  Labs Reviewed - No data to display ____________________________________________  EKG   ____________________________________________  RADIOLOGY   No results found.  ____________________________________________    PROCEDURES  Procedure(s) performed:    Procedures    Medications - No data to display   ____________________________________________   INITIAL IMPRESSION / ASSESSMENT AND PLAN / ED COURSE  Pertinent labs & imaging results that were available during my care of the patient were reviewed by me and considered in my medical decision making (see chart for details).  Review of the [place] CSRS was performed in accordance of the [facility] prior to dispensing any controlled drugs.     Patient's diagnosis is consistent with influenza. Vital signs and exam are reassuring. Husband was diagnosed with influenza on Friday. Patient will be discharged home with prescriptions for Tamiflu and tessalon pearls. Patient is to follow up with PCP as needed or otherwise directed. Patient is given ED precautions to return to the ED for any worsening or new symptoms.     ____________________________________________  FINAL CLINICAL IMPRESSION(S) / ED  DIAGNOSES  Final diagnoses:  Influenza      NEW MEDICATIONS STARTED DURING THIS VISIT:  Discharge Medication List as of 01/02/2017  1:10 PM    START taking these medications   Details  benzonatate (TESSALON PERLES) 100 MG capsule Take 1 capsule (100 mg total) by mouth 3 (three) times daily as needed for cough., Starting Sun 01/02/2017, Until Mon 01/02/2018, Print    oseltamivir (TAMIFLU) 75 MG capsule Take 1 capsule (75 mg total) by mouth 2 (two) times daily., Starting Sun 01/02/2017, Until Wed 01/12/2017, Print            This chart was dictated using voice recognition software/Dragon. Despite best efforts to proofread, errors can occur which can change the meaning. Any change was purely unintentional.    Enid Derry, PA-C 01/02/17 1855    Willy Eddy, MD 01/03/17 [phone]

## 2017-01-02 NOTE — ED Notes (Signed)
See triage note. Pt c/o headache, hot flashes, chills, n/v, and diarrhea since Friday. Able to keep clear liquids down at this time. Husband recently had same s/sx and was given flu dx. 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

## 2017-01-02 NOTE — ED Triage Notes (Signed)
Exposed to flu from her husband who was dx - now has symptoms. No pcp

## 2017-01-05 ENCOUNTER — Emergency Department
Admission: EM | Admit: 2017-01-05 | Discharge: 2017-01-05 | Disposition: A | Discharge status: Home / Self Care | Payer: BC Managed Care – PPO | Attending: Emergency Medicine | Admitting: Emergency Medicine

## 2017-01-05 ENCOUNTER — Encounter: Payer: Self-pay | Admitting: Emergency Medicine

## 2017-01-05 DIAGNOSIS — J09X2 Influenza due to identified novel influenza A virus with other respiratory manifestations: Secondary | ICD-10-CM | POA: Diagnosis not present

## 2017-01-05 DIAGNOSIS — J101 Influenza due to other identified influenza virus with other respiratory manifestations: Secondary | ICD-10-CM

## 2017-01-05 DIAGNOSIS — R51 Headache: Secondary | ICD-10-CM | POA: Diagnosis present

## 2017-01-05 MED ORDER — NAPROXEN 125 MG/5ML PO SUSP: 500.0000 mg | Freq: Two times a day (BID) | ORAL | 0 refills | Status: AC

## 2017-01-05 NOTE — ED Notes (Signed)
Pt reports having had taken Tamiflu to prevent the flu but has since began vomiting and diarrhea.  Pt verbalized feeling cold chills and sweats but reports not having had a fever. Pt also verbalized having pain left side of upper lip that was not relieved after tylenol and gabapentin that was taken from an old prescription.

## 2017-01-05 NOTE — ED Triage Notes (Signed)
Patient comes into the ED via POv c/o flu-like symptoms.  Patient was treated prophylactic due to her boyfriends being treated for the flu.  Patient states her symptoms have gotten worse since taking the tamiflu.   States she has had increased chills and body aches.  Patient in NAD at this time with even and unlabored respirations.

## 2017-01-05 NOTE — ED Triage Notes (Signed)
Patient states that she was seen here Sunday and diagnosed with the flu and started on tamiflu. Patient states that she now feels worse. Patient states that she is now having left side dental pain from biting a capsule.

## 2017-01-06 NOTE — ED Provider Notes (Signed)
[facility] Emergency Department Provider Note  ____________________________________________  Time seen: Approximately 12:16 AM  I have reviewed the triage vital signs and the nursing notes.   HISTORY  Chief Complaint flu-like symptoms    HPI Katelyn Ellis is a 36 y.o. female presenting to the emergency department with headache, congestion, malaise, pharyngitis, rhinorrhea and fatigue for the past 4 days. Patient was diagnosed with influenza on 01/02/2017. She has been tolerating fluids by mouth. She has had diminished appetite and increased sleep. Patient is concerned because she is "getting any better". She is presenting to the emergency department today for reassurance. 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 Patient has been taking Tamiflu and Tessalon Perles.   Past Medical History:  Diagnosis Date  . Anemia   . GERD (gastroesophageal reflux disease)   . Headache     There are no active problems to display for this patient.   Past Surgical History:  Procedure Laterality Date  . CHOLECYSTECTOMY N/A 08/12/2015   Procedure: LAPAROSCOPIC CHOLECYSTECTOMY WITH INTRAOPERATIVE CHOLANGIOGRAM;  Surgeon: Nadeen LandauJarvis Wilton Smith, MD;  Location: [facility] ORS;  Service: General;  Laterality: N/A;  . NO PAST SURGERIES      Prior to Admission medications   Medication Sig Start Date End Date Taking? Authorizing Provider  benzonatate (TESSALON PERLES) 100 MG capsule Take 1 capsule (100 mg total) by mouth 3 (three) times daily as needed for cough. 01/02/17 01/02/18  Enid DerryAshley Wagner, PA-C  HYDROcodone-acetaminophen (NORCO) 5-325 MG per tablet Take 1-2 tablets by mouth every 4 (four) hours as needed for moderate pain. 08/12/15   Nadeen LandauJarvis Wilton Smith, MD  naproxen (NAPROSYN) 125 MG/5ML suspension Take 20 mLs (500 mg total) by mouth 2 (two) times daily with a meal. 01/05/17 01/10/17  Orvil FeilJaclyn M Manette Doto, PA-C  omeprazole (PRILOSEC OTC) 20 MG tablet Take 1 tablet (20 mg total) by mouth  daily. Patient taking differently: Take 20 mg by mouth every morning.  05/24/15 05/23/16  Gayla DossEryka A Gayle, MD  oseltamivir (TAMIFLU) 75 MG capsule Take 1 capsule (75 mg total) by mouth 2 (two) times daily. 01/02/17 01/12/17  Enid DerryAshley Wagner, PA-C    Allergies Benadryl [diphenhydramine hcl (sleep)]; Codeine; Penicillins; and Zithromax [azithromycin]  No family history on file.  Social History Social History  Substance Use Topics  . Smoking status: Never Smoker  . Smokeless tobacco: Never Used  . Alcohol use No     Review of Systems  Constitutional: Patient has had fever.  Eyes: No visual changes. No discharge ENT: Patient has had congestion.  Cardiovascular: no chest pain. Respiratory: Patient has had non-productive cough.  No SOB. Gastrointestinal: Patient has had nausea.  Genitourinary: Negative for dysuria. No hematuria Musculoskeletal: Patient has had myalgias. Skin: Negative for rash, abrasions, lacerations, ecchymosis. Neurological: Patient has headache, no focal weakness or numbness. ____________________________________________   PHYSICAL EXAM:  VITAL SIGNS: ED Triage Vitals  Enc Vitals Group     BP 01/05/17 2002 134/88     Pulse Rate 01/05/17 2002 92     Resp 01/05/17 2002 18     Temp 01/05/17 2002 98.5 F (36.9 C)     Temp Source 01/05/17 2002 Oral     SpO2 01/05/17 2002 100 %     Weight 01/05/17 2002 150 lb (68 kg)     Height 01/05/17 2002 5\' 4"  (1.626 m)     Head Circumference --      Peak Flow --      Pain Score 01/05/17 2007 10  Pain Loc --      Pain Edu? --      Excl. in GC? --     Constitutional: Alert and oriented. Patient is lying supine in bed.  Eyes: Conjunctivae are normal. PERRL. EOMI. Head: Atraumatic. ENT:      Ears: Tympanic membranes are injected bilaterally without evidence of effusion or purulent exudate. Bony landmarks are visualized bilaterally. No pain with palpation at the tragus.      Nose: Nasal turbinates are edematous and  erythematous. Copious rhinorrhea visualized.      Mouth/Throat: Mucous membranes are moist. Posterior pharynx is mildly erythematous. No tonsillar hypertrophy or purulent exudate. Uvula is midline. Neck: Full range of motion. No pain is elicited with flexion at the neck. Hematological/Lymphatic/Immunilogical: No cervical lymphadenopathy. Cardiovascular: Normal rate, regular rhythm. Normal S1 and S2.  Good peripheral circulation. Respiratory: Normal respiratory effort without tachypnea or retractions. Lungs CTAB. Good air entry to the bases with no decreased or absent breath sounds. Gastrointestinal: Bowel sounds 4 quadrants. Soft and nontender to palpation. No guarding or rigidity. No palpable masses. No distention. No CVA tenderness.  Skin:  Skin is warm, dry and intact. No rash noted. Psychiatric: Mood and affect are normal. Speech and behavior are normal. Patient exhibits appropriate insight and judgement.  ____________________________________________   LABS (all labs ordered are listed, but only abnormal results are displayed)  Labs Reviewed - No data to display ____________________________________________  EKG   ____________________________________________  RADIOLOGY   No results found.  ____________________________________________    PROCEDURES  Procedure(s) performed:    Procedures    Medications - No data to display   ____________________________________________   INITIAL IMPRESSION / ASSESSMENT AND PLAN / ED COURSE  Pertinent labs & imaging results that were available during my care of the patient were reviewed by me and considered in my medical decision making (see chart for details).  Review of the [place] CSRS was performed in accordance of the N[facility] prior to dispensing any controlled drugs.    Assessment and Plan Influenza:  Patient presents the emergency department with a prior diagnosis of influenza made on 01/02/2017. Patient has had influenza for the  past 4 days. On physical exam, lung sounds were reassuring. Patient states that her cough has been intermittent and is not keeping her up at night. DG chest is not warranted at this time. Vital signs are reassuring at this time. Patient was discharged with naproxen. Rest and hydration were encouraged. A work note was provided.  ____________________________________________  FINAL CLINICAL IMPRESSION(S) / ED DIAGNOSES  Final diagnoses:  Influenza A      NEW MEDICATIONS STARTED DURING THIS VISIT:  Discharge Medication List as of 01/05/2017  9:26 PM    START taking these medications   Details  naproxen (NAPROSYN) 125 MG/5ML suspension Take 20 mLs (500 mg total) by mouth 2 (two) times daily with a meal., Starting Wed 01/05/2017, Until Mon 01/10/2017, Print            This chart was dictated using voice recognition software/Dragon. Despite best efforts to proofread, errors can occur which can change the meaning. Any change was purely unintentional.    Orvil Feil, PA-C 01/06/17 0022    Rockne Menghini, MD 01/11/17 [phone]

## 2017-01-11 ENCOUNTER — Emergency Department
Admission: EM | Admit: 2017-01-11 | Discharge: 2017-01-11 | Disposition: A | Discharge status: Home / Self Care | Payer: BC Managed Care – PPO | Attending: Emergency Medicine | Admitting: Emergency Medicine

## 2017-01-11 ENCOUNTER — Encounter: Payer: Self-pay | Admitting: Emergency Medicine

## 2017-01-11 DIAGNOSIS — K0889 Other specified disorders of teeth and supporting structures: Secondary | ICD-10-CM | POA: Diagnosis present

## 2017-01-11 DIAGNOSIS — Z79899 Other long term (current) drug therapy: Secondary | ICD-10-CM | POA: Diagnosis not present

## 2017-01-11 DIAGNOSIS — K0389 Other specified diseases of hard tissues of teeth: Secondary | ICD-10-CM | POA: Diagnosis not present

## 2017-01-11 MED ORDER — TRAMADOL HCL 50 MG PO TABS: 50.0000 mg | ORAL_TABLET | Freq: Four times a day (QID) | ORAL | 0 refills | Status: AC | PRN

## 2017-01-11 MED ORDER — PREDNISOLONE 15 MG/5ML PO SOLN: 40.0000 mg | Freq: Every day | ORAL | 0 refills | Status: AC

## 2017-01-11 MED ORDER — TRAMADOL HCL 50 MG PO TABS
50.0000 mg | ORAL_TABLET | Freq: Once | ORAL | Status: AC
  Administered 2017-01-11: 50 mg via ORAL
  Filled 2017-01-11: qty 1

## 2017-01-11 NOTE — ED Notes (Signed)
Pt reports that she took tamiflu and since then she has had a pain in her jaw/mouth/teeth/ear on the left side - pt dentist is out of town and recommended that she come to the er - pt reports no relief from OTC meds

## 2017-01-11 NOTE — ED Triage Notes (Signed)
Patient presents to the ED with left sided dental pain x 1 week.  Patient states her teeth ares sensitive and she has difficulty chewing.  Patient reports taking otc pain medication without relief.  Patient reports her dentist could not see her until next week.

## 2017-01-11 NOTE — ED Provider Notes (Signed)
[facility] Emergency Department Provider Note  ____________________________________________  Time seen: Approximately 7:55 PM  I have reviewed the triage vital signs and the nursing notes.   HISTORY  Chief Complaint Dental Pain    HPI Katelyn Ellis is a 36 y.o. female that presents to the emergency department with left upper tooth sensitivity.Patient describes pain as sharp.  She states that the pain began in tooth after taking Tessalon Perles. She is concerned that she was having allergic reaction to [employer]. She saw the dentist a couple months ago and was told that she needed a root canal on upper back molar and needed wisdom teeth removed. She denies pain around tooth that needs root canal or wisdom tooth. Patients dentist is out of town. Patient denies fever, swelling, drainage, throat pain, ear pain, itching, throat tightening, difficulty swallowing, shortness breath, chest pain, abdominal pain.   Past Medical History:  Diagnosis Date  . Anemia   . GERD (gastroesophageal reflux disease)   . Headache     There are no active problems to display for this patient.   Past Surgical History:  Procedure Laterality Date  . CHOLECYSTECTOMY N/A 08/12/2015   Procedure: LAPAROSCOPIC CHOLECYSTECTOMY WITH INTRAOPERATIVE CHOLANGIOGRAM;  Surgeon: Nadeen Landau, MD;  Location: [facility] ORS;  Service: General;  Laterality: N/A;  . NO PAST SURGERIES      Prior to Admission medications   Medication Sig Start Date End Date Taking? Authorizing Provider  benzonatate (TESSALON PERLES) 100 MG capsule Take 1 capsule (100 mg total) by mouth 3 (three) times daily as needed for cough. 01/02/17 01/02/18  Enid Derry, PA-C  HYDROcodone-acetaminophen (NORCO) 5-325 MG per tablet Take 1-2 tablets by mouth every 4 (four) hours as needed for moderate pain. 08/12/15   Nadeen Landau, MD  omeprazole (PRILOSEC OTC) 20 MG tablet Take 1 tablet (20 mg total) by mouth  daily. Patient taking differently: Take 20 mg by mouth every morning.  05/24/15 05/23/16  Gayla Doss, MD  oseltamivir (TAMIFLU) 75 MG capsule Take 1 capsule (75 mg total) by mouth 2 (two) times daily. 01/02/17 01/12/17  Enid Derry, PA-C  prednisoLONE (PRELONE) 15 MG/5ML SOLN Take 13.3 mLs (40 mg total) by mouth daily before breakfast. 01/11/17 01/16/17  Enid Derry, PA-C  traMADol (ULTRAM) 50 MG tablet Take 1 tablet (50 mg total) by mouth every 6 (six) hours as needed. 01/11/17 01/11/18  Enid Derry, PA-C    Allergies Benadryl [diphenhydramine hcl (sleep)]; Codeine; Penicillins; and Zithromax [azithromycin]  No family history on file.  Social History Social History  Substance Use Topics  . Smoking status: Never Smoker  . Smokeless tobacco: Never Used  . Alcohol use No     Review of Systems  Constitutional: No fever/chills ENT: No upper respiratory complaints. Cardiovascular: No chest pain. Respiratory: No cough. No SOB. Gastrointestinal: No abdominal pain.  No nausea, no vomiting.  Musculoskeletal: Negative for musculoskeletal pain. Skin: Negative for rash, abrasions, lacerations, ecchymosis. Neurological: Negative for headaches, numbness or tingling   ____________________________________________   PHYSICAL EXAM:  VITAL SIGNS: ED Triage Vitals  Enc Vitals Group     BP 01/11/17 1705 124/79     Pulse Rate 01/11/17 1705 74     Resp 01/11/17 1705 16     Temp 01/11/17 1705 98 F (36.7 C)     Temp Source 01/11/17 1705 Oral     SpO2 01/11/17 1705 100 %     Weight 01/11/17 1706 150 lb (68 kg)  Height 01/11/17 1706 5\' 4"  (1.626 m)     Head Circumference --      Peak Flow --      Pain Score 01/11/17 1706 10     Pain Loc --      Pain Edu? --      Excl. in GC? --      Constitutional: Alert and oriented. Well appearing and in no acute distress. Eyes: Conjunctivae are normal. PERRL. EOMI. Head: Atraumatic. ENT:      Ears:Tympanic membranes pearly gray with good  landmarks.      Nose: No congestion/rhinnorhea.      Mouth/Throat: Mucous membranes are moist. Tonsils nonenlarged. Oropharynx non-erythematous. Tooth #15 and tooth #17 with decay. No tenderness to palpation around either tooth. The patient has tenderness to palpation around tooth #14. No swelling present. Neck: No stridor.   Hematological/Lymphatic/Immunilogical: No cervical lymphadenopathy. Cardiovascular: Normal rate, regular rhythm.  Good peripheral circulation. Respiratory: Normal respiratory effort without tachypnea or retractions. Lungs CTAB. Good air entry to the bases with no decreased or absent breath sounds. Musculoskeletal: Full range of motion to all extremities. No gross deformities appreciated. Neurologic:  Normal speech and language. No gross focal neurologic deficits are appreciated.  Skin:  Skin is warm, dry and intact. No rash noted.   ____________________________________________   LABS (all labs ordered are listed, but only abnormal results are displayed)  Labs Reviewed - No data to display ____________________________________________  EKG   ____________________________________________  RADIOLOGY   No results found.  ____________________________________________    PROCEDURES  Procedure(s) performed:    Procedures    Medications  traMADol (ULTRAM) tablet 50 mg (50 mg Oral Given 01/11/17 1907)     ____________________________________________   INITIAL IMPRESSION / ASSESSMENT AND PLAN / ED COURSE  Pertinent labs & imaging results that were available during my care of the patient were reviewed by me and considered in my medical decision making (see chart for details).  Review of the [place] CSRS was performed in accordance of the N[facility] prior to dispensing any controlled drugs.   Patient's diagnosis is consistent with tooth sensitivity. Vital signs and exam are reassuring. Patient does not have tenderness to palpation around teeth with decay. No  indication of infection. No indication for antibiotics at this time. No swelling present. Patient will be discharged home with prescriptions for prednisone. Patient is to follow up with dentist this week. Patient was given resources for dentist in the area. Patient is given ED precautions to return to the ED for any worsening or new symptoms.     ____________________________________________  FINAL CLINICAL IMPRESSION(S) / ED DIAGNOSES  Final diagnoses:  Tooth sensitivity      NEW MEDICATIONS STARTED DURING THIS VISIT:  New Prescriptions   PREDNISOLONE (PRELONE) 15 MG/5ML SOLN    Take 13.3 mLs (40 mg total) by mouth daily before breakfast.   TRAMADOL (ULTRAM) 50 MG TABLET    Take 1 tablet (50 mg total) by mouth every 6 (six) hours as needed.        This chart was dictated using voice recognition software/Dragon. Despite best efforts to proofread, errors can occur which can change the meaning. Any change was purely unintentional.    Enid Derryshley Pavielle Biggar, PA-C 01/11/17 2017    Nita Sicklearolina Veronese, MD 01/11/17 2249

## 2017-01-11 NOTE — Discharge Instructions (Signed)
OPTIONS FOR DENTAL FOLLOW UP CARE ° °[place] Department of Health and Human Services - Local Safety Net Dental Clinics °http://www.ncdhhs.gov/dph/oralhealth/services/safetynetclinics.htm °  °[facility] (336-562-3123) ° °[facility] [place] ([phone]) ° °[facility] ([phone] ext [phone]) ° °[facility] ([phone]) ° °[facility] ([phone]) °This clinic caters to the indigent population and is on a lottery system. °Location: °[facility], [address], [place] °Clinic Hours: °Wednesdays from 6pm - 9pm, patients seen by a lottery system. °For dates, call or go to [email or web address].edu/[facility]/patients/Dental-[email or web address] °Services: °Cleanings, fillings and simple extractions. °Payment Options: °DENTAL WORK IS FREE OF CHARGE. Bring proof of income or support. °Best way to get seen: °Arrive at 5:15 pm - this is a lottery, NOT first come/first serve, so arriving earlier will not increase your chances of being seen. °  °  °[facility] °[phone] °Select option 1 for emergencies °  °Location: °[facility], [address], [place] °Clinic Hours: °No walk-ins accepted - call the day before to schedule an appointment. °Check in times are 9:30 am and 1:30 pm. °Services: °Simple extractions, temporary fillings, pulpectomy/pulp debridement, uncomplicated abscess drainage. °Payment Options: °PAYMENT IS DUE AT THE TIME OF SERVICE.  Fee is usually $100-200, additional surgical procedures (e.g. abscess drainage) may be extra. °Cash, checks, Visa/MasterCard accepted.  Can file Medicaid if patient is covered for dental - patient should call case worker to check. °No discount for [facility] patients. °Best way to get seen: °MUST call the day before and get onto the schedule. Can usually be seen the next 1-2 days. No walk-ins accepted. °  °  °[facility] °[phone] °   °Location: °[facility], [address], [place] °Clinic Hours: °M, W, Th, F 8am or 1:30pm, Tues 9a or 1:30 - first come/first served. °Services: °Simple extractions, temporary fillings, uncomplicated abscess drainage.  You do not need to be an [place] resident. °Payment Options: °PAYMENT IS DUE AT THE TIME OF SERVICE. °Dental insurance, otherwise sliding scale - bring proof of income or support. °Depending on income and treatment needed, cost is usually $50-200. °Best way to get seen: °Arrive early as it is first come/first served. °  °  °[facility] °[phone] °  °Location: °[address] °Clinic Hours: °Mon-Thu 8a-5p °Services: °Most basic dental services including extractions and fillings. °Payment Options: °PAYMENT IS DUE AT THE TIME OF SERVICE. °Sliding scale, up to 50% off - bring proof if income or support. °Medicaid with dental option accepted. °Best way to get seen: °Call to schedule an appointment, can usually be seen within 2 weeks OR they will try to see walk-ins - show up at 8a or 2p (you may have to wait). °  °  °[facility] °[phone] °[place] RESIDENTS ONLY °  °Location: °[facility], [address], [place], [place] [address] °Clinic Hours: By appointment only. °Monday - Thursday 8am-5pm, Friday 8am-12pm °Services: Cleanings, fillings, extractions. °Payment Options: °PAYMENT IS DUE AT THE TIME OF SERVICE. °Cash, Visa or MasterCard. Sliding scale - $30 minimum per service. °Best way to get seen: °Come in to office, complete packet and make an appointment - need proof of income °or support monies for each household member and proof of [place] residence. °Usually takes about a month to get in. °  °  °[facility] °[phone] °  °Location: °[address]   [place] °Clinic Hours: Walk-in Urgent Care Dental Services are offered Monday-Friday  mornings only. °The numbers of emergencies accepted daily is limited to the number of °providers available. °Maximum 15 - Mondays, Wednesdays & Thursdays °Maximum 10 - Tuesdays & Fridays °Services: °You do not need to be a [place] resident to be seen for a dental emergency. °Emergencies are defined as pain, swelling, abnormal bleeding, or dental trauma. Walkins will receive x-rays if needed. °NOTE: Dental cleaning is not an emergency. °Payment Options: °PAYMENT IS DUE AT THE TIME OF SERVICE. °Minimum co-pay is $40.00 for uninsured patients. °Minimum co-pay is $3.00 for Medicaid with dental coverage. °Dental Insurance is accepted and must be presented at time of visit. °Medicare does not cover dental. °Forms of payment: Cash, credit card, checks. °Best way to get seen: °If not previously registered with the clinic, walk-in dental registration begins at 7:15 am and is on a first come/first serve basis. °If previously registered with the clinic, call to make an appointment. °  °  °[facility] °[phone] °[place] RESIDENTS ONLY °  °Location: °[address], [place], [place] °Clinic Hours: °Mon-Thu 10a-2p °Services: Extractions only! °Payment Options: °FREE (donations accepted) - bring proof of income or support °Best way to get seen: °Call and schedule an appointment OR come at 8am on the 1st Monday of every month (except for holidays) when it is first come/first served. °  °  °[facility] °[phone] °  °Location: °[address], [place] °Clinic Hours: °Friday mornings °Services, Payment Options, Best way to get seen: °Call for info °

## 2017-09-24 ENCOUNTER — Emergency Department
Admission: EM | Admit: 2017-09-24 | Discharge: 2017-09-24 | Disposition: A | Discharge status: Home / Self Care | Payer: BC Managed Care – PPO | Attending: Emergency Medicine | Admitting: Emergency Medicine

## 2017-09-24 ENCOUNTER — Encounter: Payer: Self-pay | Admitting: Emergency Medicine

## 2017-09-24 DIAGNOSIS — M5412 Radiculopathy, cervical region: Secondary | ICD-10-CM

## 2017-09-24 DIAGNOSIS — M25511 Pain in right shoulder: Secondary | ICD-10-CM | POA: Diagnosis present

## 2017-09-24 MED ORDER — MELOXICAM 15 MG PO TABS
15.0000 mg | ORAL_TABLET | Freq: Every day | ORAL | 0 refills | Status: DC
Start: 2017-09-24 — End: 2018-12-23

## 2017-09-24 MED ORDER — METHOCARBAMOL 500 MG PO TABS: 500.0000 mg | ORAL_TABLET | Freq: Four times a day (QID) | ORAL | 0 refills | Status: DC

## 2017-09-24 NOTE — ED Notes (Signed)
Patient reports right shoulder pain x 3 weeks.  Denies any injury to shoulder.  Sensation and mobility is intact in her right hand.  No obvious distress at this time.

## 2017-09-24 NOTE — ED Provider Notes (Signed)
[facility] Emergency Department Provider Note  ____________________________________________  Time seen: Approximately 4:16 PM  I have reviewed the triage vital signs and the nursing notes.   HISTORY  Chief Complaint Shoulder Pain    HPI Katelyn Ellis is a 36 y.o. female Who presents to the emergency department complaining of right shoulder and arm pain. Patient reports that for the last 3 weeks she has had bilateral shoulder and arm pain. She was evaluated at urgent care after she began to have some chest pressure. Patient was advised that symptoms were stress related in that she did not have a heart attack. Patient was advised to use gabapentin which she had a prescription for for previous element. Patient reports that the left shoulder and arm pain has resolved, no chest pain but she continues to have right shoulder pain. The right sided pain increased over the past several days and she presents to emergency department complaining of right shoulder and right arm pain. Patient reports that it is a sharp, throbbing, numb and tingling sensation. Patient denies any trauma or precipitating any of these complaints. No history of neck problems. No history of heart problems. Patient has been taking Tylenol and gabapentin. No other complaints. No other medications.   Past Medical History:  Diagnosis Date  . Anemia   . GERD (gastroesophageal reflux disease)   . Headache     There are no active problems to display for this patient.   Past Surgical History:  Procedure Laterality Date  . CHOLECYSTECTOMY N/A 08/12/2015   Procedure: LAPAROSCOPIC CHOLECYSTECTOMY WITH INTRAOPERATIVE CHOLANGIOGRAM;  Surgeon: Nadeen Landau, MD;  Location: [facility] ORS;  Service: General;  Laterality: N/A;  . NO PAST SURGERIES      Prior to Admission medications   Medication Sig Start Date End Date Taking? Authorizing Provider  benzonatate (TESSALON PERLES) 100 MG capsule Take 1  capsule (100 mg total) by mouth 3 (three) times daily as needed for cough. 01/02/17 01/02/18  Enid Derry, PA-C  HYDROcodone-acetaminophen (NORCO) 5-325 MG per tablet Take 1-2 tablets by mouth every 4 (four) hours as needed for moderate pain. 08/12/15   Nadeen Landau, MD  meloxicam (MOBIC) 15 MG tablet Take 1 tablet (15 mg total) by mouth daily. 09/24/17   Nancy Arvin, Delorise Royals, PA-C  methocarbamol (ROBAXIN) 500 MG tablet Take 1 tablet (500 mg total) by mouth 4 (four) times daily. 09/24/17   Tanya Marvin, Delorise Royals, PA-C  omeprazole (PRILOSEC OTC) 20 MG tablet Take 1 tablet (20 mg total) by mouth daily. Patient taking differently: Take 20 mg by mouth every morning.  05/24/15 05/23/16  Gayla Doss, MD  traMADol (ULTRAM) 50 MG tablet Take 1 tablet (50 mg total) by mouth every 6 (six) hours as needed. 01/11/17 01/11/18  Enid Derry, PA-C    Allergies Benadryl [diphenhydramine hcl (sleep)]; Codeine; Penicillins; and Zithromax [azithromycin]  No family history on file.  Social History Social History  Substance Use Topics  . Smoking status: Never Smoker  . Smokeless tobacco: Never Used  . Alcohol use No     Review of Systems  Constitutional: No fever/chills Eyes: No visual changes.  Cardiovascular: no chest pain. Respiratory: no cough. No SOB. Gastrointestinal: No abdominal pain.  No nausea, no vomiting.   Musculoskeletal: Positive for R shoulder and and R arm pain Skin: Negative for rash, abrasions, lacerations, ecchymosis. Neurological: Negative for headaches, focal weakness or numbness. 10-point ROS otherwise negative.  ____________________________________________   PHYSICAL EXAM:  VITAL SIGNS: ED Triage Vitals  Enc Vitals Group     BP 09/24/17 1436 128/77     Pulse Rate 09/24/17 1436 76     Resp 09/24/17 1436 20     Temp 09/24/17 1437 98.5 F (36.9 C)     Temp Source 09/24/17 1436 Oral     SpO2 09/24/17 1436 100 %     Weight 09/24/17 1436 140 lb (63.5 kg)      Height 09/24/17 1436  (1.626 m)     Head Circumference --      Peak Flow --      Pain Score 09/24/17 1439 7     Pain Loc --      Pain Edu? --      Excl. in GC? --      Constitutional: Alert and oriented. Well appearing and in no acute distress. Eyes: Conjunctivae are normal. PERRL. EOMI. Head: Atraumatic. ENT:      Ears:       Nose: No congestion/rhinnorhea.      Mouth/Throat: Mucous membranes are moist.  Neck: No stridor.  No midline cervical spine tenderness to palpation. No tenderness to palpation over L paraspinal group. Patient is tender along the right-sided paraspinal group. No other tenderness to palpation over the osseous structures on the shoulder. Good range of motion to the right shoulder. Examination of the elbow and wrist are unremarkable. Patient reports mild decrease in sensation on right side of when compared to left but sensation intact all 5 digits. Capillary refill intact all 5 digits. Cardiovascular: Normal rate, regular rhythm. Normal S1 and S2.  Good peripheral circulation. Respiratory: Normal respiratory effort without tachypnea or retractions. Lungs CTAB. Good air entry to the bases with no decreased or absent breath sounds. Musculoskeletal: Full range of motion to all extremities. No gross deformities appreciated. Neurologic:  Normal speech and language. No gross focal neurologic deficits are appreciated.  Skin:  Skin is warm, dry and intact. No rash noted. Psychiatric: Mood and affect are normal. Speech and behavior are normal. Patient exhibits appropriate insight and judgement.   ____________________________________________   LABS (all labs ordered are listed, but only abnormal results are displayed)  Labs Reviewed - No data to display ____________________________________________  EKG   ____________________________________________  RADIOLOGY   No results found.  ____________________________________________    PROCEDURES  Procedure(s)  performed:    Procedures    Medications - No data to display   ____________________________________________   INITIAL IMPRESSION / ASSESSMENT AND PLAN / ED COURSE  Pertinent labs & imaging results that were available during my care of the patient were reviewed by me and considered in my medical decision making (see chart for details).  Review of the [place] CSRS was performed in accordance of the [facility] prior to dispensing any controlled drugs.     Patient's diagnosis is consistent with cervical radiculopathy. Patient has had 3 weeks of pain with mild numbness and tingling to bilateral upper extremity's. Left side has resolved, right side continues. Patient is tender to palpation right paraspinal region. No palpable abnormality. Good range of motion to the right arm. Pulses and sensation intact distally. No indication for labs or imaging at this time. Patient has been taking gabapentin which she is to continue and I will place patient on meloxicam and Robaxin.Marland Kitchen Patient will follow-up with primary care or orthopedics as needed. Patient is given ED precautions to return to the ED for any worsening or new symptoms.     ____________________________________________  FINAL CLINICAL IMPRESSION(S) / ED DIAGNOSES  Final diagnoses:  Cervical radiculopathy      NEW MEDICATIONS STARTED DURING THIS VISIT:  New Prescriptions   MELOXICAM (MOBIC) 15 MG TABLET    Take 1 tablet (15 mg total) by mouth daily.   METHOCARBAMOL (ROBAXIN) 500 MG TABLET    Take 1 tablet (500 mg total) by mouth 4 (four) times daily.        This chart was dictated using voice recognition software/Dragon. Despite best efforts to proofread, errors can occur which can change the meaning. Any change was purely unintentional.    Racheal Patches, PA-C 09/24/17 1646    Jeanmarie Plant, MD 09/24/17 2104

## 2017-09-24 NOTE — ED Notes (Signed)
Pt. Going home with friend 

## 2017-09-24 NOTE — ED Triage Notes (Signed)
Pt to ED via POV c/o right shoulder pain x 3 weeks. Pt denies any known injury. Pt states that originally she had pain in both arms but now the pain is only in the right arm. Pt in NAD at this time.

## 2018-12-23 ENCOUNTER — Other Ambulatory Visit: Payer: Self-pay

## 2018-12-23 ENCOUNTER — Emergency Department
Admission: EM | Admit: 2018-12-23 | Discharge: 2018-12-23 | Disposition: A | Discharge status: Home / Self Care | Payer: BC Managed Care – PPO | Attending: Emergency Medicine | Admitting: Emergency Medicine

## 2018-12-23 DIAGNOSIS — R509 Fever, unspecified: Secondary | ICD-10-CM | POA: Diagnosis present

## 2018-12-23 DIAGNOSIS — J101 Influenza due to other identified influenza virus with other respiratory manifestations: Secondary | ICD-10-CM | POA: Insufficient documentation

## 2018-12-23 LAB — INFLUENZA PANEL BY PCR (TYPE A & B)
Influenza A By PCR: NEGATIVE
Influenza B By PCR: POSITIVE — AB

## 2018-12-23 LAB — GROUP A STREP BY PCR: GROUP A STREP BY PCR: NOT DETECTED

## 2018-12-23 MED ORDER — IBUPROFEN 100 MG/5ML PO SUSP
600.0000 mg | Freq: Once | ORAL | Status: AC
  Administered 2018-12-23: 600 mg via ORAL
  Filled 2018-12-23: qty 30

## 2018-12-23 MED ORDER — OSELTAMIVIR PHOSPHATE 6 MG/ML PO SUSR: 75.0000 mg | Freq: Two times a day (BID) | ORAL | 0 refills | Status: DC

## 2018-12-23 NOTE — ED Provider Notes (Signed)
[facility] Emergency Department Provider Note  ____________________________________________   First MD Initiated Contact with Patient 12/23/18 1443     (approximate)  I have reviewed the triage vital signs and the nursing notes.   HISTORY  Chief Complaint Generalized Body Aches    HPI Katelyn Ellis is a 38 y.o. female flulike symptoms, patient  complained of fever, chills, body aches, cough, sore throat, denies vomiting, denies diarrhea; denies chest pain or sob.  Sx for 1 days.  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  She is concerned that she could have flu and/or strep throat.  She states her chest hurts with cough but does not feel short of breath.   Past Medical History:  Diagnosis Date  . Anemia   . GERD (gastroesophageal reflux disease)   . Headache     There are no active problems to display for this patient.   Past Surgical History:  Procedure Laterality Date  . CHOLECYSTECTOMY N/A 08/12/2015   Procedure: LAPAROSCOPIC CHOLECYSTECTOMY WITH INTRAOPERATIVE CHOLANGIOGRAM;  Surgeon: Nadeen Landau, MD;  Location: [facility] ORS;  Service: General;  Laterality: N/A;  . NO PAST SURGERIES      Prior to Admission medications   Medication Sig Start Date End Date Taking? Authorizing Provider  oseltamivir (TAMIFLU) 6 MG/ML SUSR suspension Take 12.5 mLs (75 mg total) by mouth 2 (two) times daily. 12/23/18   Sherrie Mustache Roselyn Bering, PA-C    Allergies Benadryl [diphenhydramine hcl (sleep)]; Codeine; Penicillins; and Zithromax [azithromycin]  History reviewed. No pertinent family history.  Social History Social History   Tobacco Use  . Smoking status: Never Smoker  . Smokeless tobacco: Never Used  Substance Use Topics  . Alcohol use: No  . Drug use: No    Review of Systems  Constitutional: Positive fever/chills Eyes: No visual changes. ENT: Positive sore throat. Respiratory: Positive  cough Genitourinary: Negative for dysuria. Musculoskeletal: Negative for back pain. Skin: Negative for rash.    ____________________________________________   PHYSICAL EXAM:  VITAL SIGNS: ED Triage Vitals [12/23/18 1245]  Enc Vitals Group     BP 119/70     Pulse Rate 95     Resp 14     Temp 100.3 F (37.9 C)     Temp Source Oral     SpO2 97 %     Weight 135 lb (61.2 kg)     Height      Head Circumference      Peak Flow      Pain Score 10     Pain Loc      Pain Edu?      Excl. in GC?     Constitutional: Alert and oriented. Well appearing and in no acute distress. Eyes: Conjunctivae are normal.  Head: Atraumatic. Nose: No congestion/rhinnorhea. Mouth/Throat: Mucous membranes are moist.  Throat is red and swollen Neck:  supple no lymphadenopathy noted Cardiovascular: Normal rate, regular rhythm. Heart sounds are normal Respiratory: Normal respiratory effort.  No retractions, lungs c t a  Abd: soft nontender bs normal all 4 quad GU: deferred Musculoskeletal: FROM all extremities, warm and well perfused Neurologic:  Normal speech and language.  Skin:  Skin is warm, dry and intact. No rash noted. Psychiatric: Mood and affect are normal. Speech and behavior are normal.  ____________________________________________   LABS (all labs ordered are listed, but only abnormal results are displayed)  Labs Reviewed  INFLUENZA PANEL BY PCR (  TYPE A & B) - Abnormal; Notable for the following components:      Result Value   Influenza B By PCR POSITIVE (*)    All other components within normal limits  GROUP A STREP BY PCR   ____________________________________________   ____________________________________________  RADIOLOGY    ____________________________________________   PROCEDURES  Procedure(s) performed: No  Procedures    ____________________________________________   INITIAL IMPRESSION / ASSESSMENT AND PLAN / ED COURSE  Pertinent labs & imaging  results that were available during my care of the patient were reviewed by me and considered in my medical decision making (see chart for details).   Patient is 39 year old female presents emergency department with flulike symptoms.  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  Physical exam shows a reddened throat, febrile patient.  She appears to feel unwell but is not toxic.  Influenza test is positive for B Strep test   Strep test is negative.  Explained all of the results to the patient.  She was given a prescription for Tamiflu.  Patient is asking for liquid medication.  She is also take Tylenol/ibuprofen for pain as needed.  Return emergency department worsening.  She states she understands will comply.  She was given a work note stating that she should not return to work until she has been fever free for 24 hours.  She was discharged in stable condition.  As part of my medical decision making, I reviewed the following data within the electronic MEDICAL RECORD NUMBER Nursing notes reviewed and incorporated, Labs reviewed flu test is positive for B, strep test negative, Old chart reviewed, Notes from prior ED visits and [place] Controlled Substance Database  ____________________________________________   FINAL CLINICAL IMPRESSION(S) / ED DIAGNOSES  Final diagnoses:  Influenza B      NEW MEDICATIONS STARTED DURING THIS VISIT:  Discharge Medication List as of 12/23/2018  4:24 PM    START taking these medications   Details  oseltamivir (TAMIFLU) 6 MG/ML SUSR suspension Take 12.5 mLs (75 mg total) by mouth 2 (two) times daily., Starting Sat 12/23/2018, Normal         Note:  This document was prepared using Dragon voice recognition software and may include unintentional dictation errors.    Faythe Ghee, PA-C 12/23/18 1705    Arnaldo Natal, MD 12/23/18 Kristopher Oppenheim

## 2018-12-23 NOTE — ED Triage Notes (Signed)
Pt presents via POV c/o flu-like symptoms. 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 Report generalized body aches, ear pain, throat pain.

## 2018-12-23 NOTE — Discharge Instructions (Addendum)
Take medication as prescribed.  Drink plenty of fluids.  Take Tylenol and ibuprofen for a fever as needed.  Gargle with warm salt water.  Over-the-counter cough medicine as needed.  Tamiflu as prescribed.  Return if worsening.

## 2019-12-25 ENCOUNTER — Other Ambulatory Visit: Payer: BC Managed Care – PPO

## 2019-12-25 ENCOUNTER — Ambulatory Visit: Payer: BC Managed Care – PPO | Attending: Internal Medicine

## 2019-12-25 DIAGNOSIS — Z20822 Contact with and (suspected) exposure to covid-19: Secondary | ICD-10-CM

## 2019-12-26 LAB — NOVEL CORONAVIRUS, NAA: SARS-CoV-2, NAA: NOT DETECTED

## 2019-12-27 ENCOUNTER — Ambulatory Visit: Payer: Self-pay | Admitting: *Deleted

## 2019-12-27 NOTE — Telephone Encounter (Signed)
Patient called re COVID test result being negative but feels more sick everyday. Questioned about symptoms, pt to retest. All provided for isolation until results.  Reason for Disposition . COVID-19 Home Isolation, questions about  Answer Assessment - Initial Assessment Questions 1. COVID-19 DIAGNOSIS: "Who made your Coronavirus (COVID-19) diagnosis?" "Was it confirmed by a positive lab test?" If not diagnosed by a HCP, ask "Are there lots of cases (community spread) where you live?" (See public health department website, if unsure)     Not dx yet 2. COVID-19 EXPOSURE: "Was there any known exposure to COVID before the symptoms began?" CDC Definition of close contact: within 6 feet (2 meters) for a total of 15 minutes or more over a 24-hour period.      Last week, there all week 3. ONSET: "When did the COVID-19 symptoms start?"      1/11 4. WORST SYMPTOM: "What is your worst symptom?" (e.g., cough, fever, shortness of breath, muscle aches)     headache 5. COUGH: "Do you have a cough?" If so, ask: "How bad is the cough?"       no 6. FEVER: "Do you have a fever?" If so, ask: "What is your temperature, how was it measured, and when did it start?"     100.1 7. RESPIRATORY STATUS: "Describe your breathing?" (e.g., shortness of breath, wheezing, unable to speak)      No problems 8. BETTER-SAME-WORSE: "Are you getting better, staying the same or getting worse compared to yesterday?"  If getting worse, ask, "In what way?"     worse 9. HIGH RISK DISEASE: "Do you have any chronic medical problems?" (e.g., asthma, heart or lung disease, weak immune system, obesity, etc.)     no 10. PREGNANCY: "Is there any chance you are pregnant?" "When was your last menstrual period?"       no 11. OTHER SYMPTOMS: "Do you have any other symptoms?"  (e.g., chills, fatigue, headache, loss of smell or taste, muscle pain, sore throat; new loss of smell or taste especially support the diagnosis of COVID-19)      Headache,  fever  Protocols used: CORONAVIRUS (COVID-19) DIAGNOSED OR SUSPECTED-A-AH

## 2020-12-27 ENCOUNTER — Ambulatory Visit
Admission: EM | Admit: 2020-12-27 | Discharge: 2020-12-27 | Disposition: A | Discharge status: Home / Self Care | Payer: BC Managed Care – PPO | Attending: Emergency Medicine | Admitting: Emergency Medicine

## 2020-12-27 ENCOUNTER — Other Ambulatory Visit: Payer: Self-pay

## 2020-12-27 DIAGNOSIS — J069 Acute upper respiratory infection, unspecified: Secondary | ICD-10-CM | POA: Insufficient documentation

## 2020-12-27 DIAGNOSIS — Z20822 Contact with and (suspected) exposure to covid-19: Secondary | ICD-10-CM | POA: Insufficient documentation

## 2020-12-27 MED ORDER — ALBUTEROL SULFATE HFA 108 (90 BASE) MCG/ACT IN AERS: 2.0000 | INHALATION_SPRAY | RESPIRATORY_TRACT | 0 refills | Status: DC | PRN

## 2020-12-27 MED ORDER — BENZONATATE 100 MG PO CAPS: 200.0000 mg | ORAL_CAPSULE | Freq: Three times a day (TID) | ORAL | 0 refills | Status: DC

## 2020-12-27 MED ORDER — AEROCHAMBER MV MISC: 2 refills | Status: AC

## 2020-12-27 MED ORDER — PROMETHAZINE-DM 6.25-15 MG/5ML PO SYRP: 5.0000 mL | ORAL_SOLUTION | Freq: Four times a day (QID) | ORAL | 0 refills | Status: DC | PRN

## 2020-12-27 NOTE — Discharge Instructions (Addendum)
Home until the results of your COVID test are back.  If your test is positive then you will need to quarantine for 5 days from the onset of your symptoms.  You can break quarantine after 5 days if your symptoms have improved and you have not had a fever for 24 hours without taking Tylenol and ibuprofen.  Use over-the-counter Tylenol and ibuprofen as needed for body aches and fever.  Use the albuterol inhaler with a spacer, 2 puffs every 4-6 hours, as needed for shortness of breath and wheezing.  Use the Tessalon Perles during the day for cough.  You can have them every 8 hours.  Use the Promethazine DM cough syrup at bedtime as it will make you drowsy.  If you develop any worsening shortness of breath-especially at rest, you are unable to speak in full sentences, or is a late sign you develop a blue tint to your lips you need go to the ER.

## 2020-12-27 NOTE — ED Provider Notes (Signed)
[facility]    CSN: [id number] Arrival date & time: 12/27/20  1531      History   Chief Complaint Chief Complaint  Patient presents with  . Cough    HPI Katelyn Ellis is a 40 y.o. female.   HPI   40 year old female here for evaluation of 4 days worth of cough, nasal congestion, and body aches.  Patient reports that she is also had some associated runny nose, headache, sore throat, and shortness of breath and wheezing that developed today.  Patient denies fever, COVID exposure, ear pain or pressure, GI complaints, or changes to her taste and smell.  Patient has had her full COVID-vaccine series including her booster and her flu vaccine.  Past Medical History:  Diagnosis Date  . Anemia   . GERD (gastroesophageal reflux disease)   . Headache     There are no problems to display for this patient.   Past Surgical History:  Procedure Laterality Date  . CHOLECYSTECTOMY N/A 08/12/2015   Procedure: LAPAROSCOPIC CHOLECYSTECTOMY WITH INTRAOPERATIVE CHOLANGIOGRAM;  Surgeon: Nadeen Landau, MD;  Location: [facility] ORS;  Service: General;  Laterality: N/A;  . NO PAST SURGERIES      OB History   No obstetric history on file.      Home Medications    Prior to Admission medications   Medication Sig Start Date End Date Taking? Authorizing Provider  albuterol (VENTOLIN HFA) 108 (90 Base) MCG/ACT inhaler Inhale 2 puffs into the lungs every 4 (four) hours as needed. 12/27/20  Yes Becky Augusta, NP  benzonatate (TESSALON) 100 MG capsule Take 2 capsules (200 mg total) by mouth every 8 (eight) hours. 12/27/20  Yes Becky Augusta, NP  promethazine-dextromethorphan (PROMETHAZINE-DM) 6.25-15 MG/5ML syrup Take 5 mLs by mouth 4 (four) times daily as needed. 12/27/20  Yes Becky Augusta, NP  Spacer/Aero-Holding Deretha Emory (AEROCHAMBER MV) inhaler Use as instructed 12/27/20  Yes Becky Augusta, NP    Family History Family History  Problem Relation Age of Onset  . Esophagitis Mother    . Diabetes Father   . Pancreatic disease Father   . Heart disease Father     Social History Social History   Tobacco Use  . Smoking status: Never Smoker  . Smokeless tobacco: Never Used  Substance Use Topics  . Alcohol use: No  . Drug use: No     Allergies   Benadryl [diphenhydramine hcl (sleep)], Codeine, Penicillins, and Zithromax [azithromycin]   Review of Systems Review of Systems  Constitutional: Negative for activity change, appetite change and fever.  HENT: Positive for congestion, rhinorrhea and sore throat. Negative for ear pain.   Respiratory: Positive for cough, shortness of breath and wheezing.   Gastrointestinal: Negative for diarrhea, nausea and vomiting.  Musculoskeletal: Positive for myalgias.  Skin: Negative for rash.  Neurological: Positive for headaches.  Psychiatric/Behavioral: Negative.      Physical Exam Triage Vital Signs ED Triage Vitals  Enc Vitals Group     BP --      Pulse --      Resp --      Temp --      Temp src --      SpO2 --      Weight 12/27/20 1623 175 lb (79.4 kg)     Height 12/27/20 1623 5\' 4"  (1.626 m)     Head Circumference --      Peak Flow --      Pain Score 12/27/20 1622 8     Pain  Loc --      Pain Edu? --      Excl. in GC? --    No data found.  Updated Vital Signs BP 129/77 (BP Location: Left Arm)   Pulse 71   Temp 98.2 F (36.8 C) (Oral)   Resp 18   Ht 5\' 4"  (1.626 m)   Wt 175 lb (79.4 kg)   LMP 11/29/2020   SpO2 100%   BMI 30.04 kg/m   Visual Acuity Right Eye Distance:   Left Eye Distance:   Bilateral Distance:    Right Eye Near:   Left Eye Near:    Bilateral Near:     Physical Exam Vitals and nursing note reviewed.  Constitutional:      General: She is not in acute distress.    Appearance: Normal appearance. She is obese. She is not toxic-appearing.  HENT:     Head: Normocephalic and atraumatic.     Right Ear: Tympanic membrane, ear canal and external ear normal.     Left Ear: Tympanic  membrane, ear canal and external ear normal.     Nose: Congestion and rhinorrhea present.     Comments: Nasal mucosa is mildly erythematous and edematous with clear nasal discharge.    Mouth/Throat:     Mouth: Mucous membranes are moist.     Pharynx: Oropharynx is clear. Posterior oropharyngeal erythema present.     Comments: Posterior oropharynx is erythematous with clear postnasal drip. Cardiovascular:     Rate and Rhythm: Normal rate and regular rhythm.     Pulses: Normal pulses.     Heart sounds: Normal heart sounds. No murmur heard.   Pulmonary:     Effort: Pulmonary effort is normal.     Breath sounds: Normal breath sounds. No wheezing, rhonchi or rales.  Musculoskeletal:     Cervical back: Normal range of motion and neck supple.  Lymphadenopathy:     Cervical: No cervical adenopathy.  Skin:    General: Skin is warm and dry.     Capillary Refill: Capillary refill takes less than 2 seconds.     Findings: No erythema.  Neurological:     General: No focal deficit present.     Mental Status: She is alert and oriented to person, place, and time.  Psychiatric:        Mood and Affect: Mood normal.        Behavior: Behavior normal.        Thought Content: Thought content normal.        Judgment: Judgment normal.      UC Treatments / Results  Labs (all labs ordered are listed, but only abnormal results are displayed) Labs Reviewed  SARS CORONAVIRUS 2 (TAT 6-24 HRS)    EKG   Radiology No results found.  Procedures Procedures (including critical care time)  Medications Ordered in UC Medications - No data to display  Initial Impression / Assessment and Plan / UC Course  I have reviewed the triage vital signs and the nursing notes.  Pertinent labs & imaging results that were available during my care of the patient were reviewed by me and considered in my medical decision making (see chart for details).   Patient is here for evaluation of COVID-like symptoms that  began 4 days ago.  Patient has a dry, nonproductive cough but is not in any respiratory distress.  Patient's lungs are clear to auscultation in all fields.  Upper respiratory exam reveals mildly erythematous and edematous nasal mucosa with  clear nasal discharge.  Posterior oropharynx has mild erythema and clear postnasal drip.  No lymphadenopathy appreciated on exam.  Will swab patient for COVID and discharge her home to isolate pending the results.  Will give patient Tessalon Perles and Promethazine DM for cough and congestion and an albuterol inhaler with spacer for shortness of breath and wheezing.  ER precautions provided.   Final Clinical Impressions(s) / UC Diagnoses   Final diagnoses:  Viral URI with cough     Discharge Instructions     Home until the results of your COVID test are back.  If your test is positive then you will need to quarantine for 5 days from the onset of your symptoms.  You can break quarantine after 5 days if your symptoms have improved and you have not had a fever for 24 hours without taking Tylenol and ibuprofen.  Use over-the-counter Tylenol and ibuprofen as needed for body aches and fever.  Use the albuterol inhaler with a spacer, 2 puffs every 4-6 hours, as needed for shortness of breath and wheezing.  Use the Tessalon Perles during the day for cough.  You can have them every 8 hours.  Use the Promethazine DM cough syrup at bedtime as it will make you drowsy.  If you develop any worsening shortness of breath-especially at rest, you are unable to speak in full sentences, or is a late sign you develop a blue tint to your lips you need go to the ER.    ED Prescriptions    Medication Sig Dispense Auth. Provider   albuterol (VENTOLIN HFA) 108 (90 Base) MCG/ACT inhaler Inhale 2 puffs into the lungs every 4 (four) hours as needed. 18 g Becky Augusta, NP   Spacer/Aero-Holding Chambers (AEROCHAMBER MV) inhaler Use as instructed 1 each Becky Augusta, NP   benzonatate  (TESSALON) 100 MG capsule Take 2 capsules (200 mg total) by mouth every 8 (eight) hours. 21 capsule Becky Augusta, NP   promethazine-dextromethorphan (PROMETHAZINE-DM) 6.25-15 MG/5ML syrup Take 5 mLs by mouth 4 (four) times daily as needed. 118 mL Becky Augusta, NP     PDMP not reviewed this encounter.   Becky Augusta, NP 12/27/20 [id number]

## 2020-12-27 NOTE — ED Triage Notes (Signed)
Patient states that complains of cough, nasal congestion, back pain and legs ache. States that she took an at home test yesterday and was negative.

## 2020-12-28 LAB — SARS CORONAVIRUS 2 (TAT 6-24 HRS): SARS Coronavirus 2: NEGATIVE

## 2022-04-01 ENCOUNTER — Ambulatory Visit
Admission: EM | Admit: 2022-04-01 | Discharge: 2022-04-01 | Disposition: A | Discharge status: Home / Self Care | Payer: BC Managed Care – PPO

## 2022-04-01 DIAGNOSIS — S40861A Insect bite (nonvenomous) of right upper arm, initial encounter: Secondary | ICD-10-CM | POA: Diagnosis not present

## 2022-04-01 DIAGNOSIS — M7989 Other specified soft tissue disorders: Secondary | ICD-10-CM

## 2022-04-01 DIAGNOSIS — W57XXXA Bitten or stung by nonvenomous insect and other nonvenomous arthropods, initial encounter: Secondary | ICD-10-CM | POA: Diagnosis not present

## 2022-04-01 MED ORDER — PREDNISONE 20 MG PO TABS: 40.0000 mg | ORAL_TABLET | Freq: Every day | ORAL | 0 refills | Status: AC

## 2022-04-01 NOTE — Discharge Instructions (Addendum)
-  You have allergic reaction that is very localized to some sort of insect bite or sting. ?- Ice the area and keep it elevated.  Take over-the-counter Claritin as long as you do not have an allergy to them.  I have sent a couple days of prednisone to help with swelling. ?- If the swelling worsens or you develop increased pain or have a fever or pustular drainage from the area you need to be seen again. ?

## 2022-04-01 NOTE — ED Triage Notes (Signed)
Pt c/o bug bite on the right bicep. Pt states that she was bit around 1:30pm and that it has gotten red and larger since. ?

## 2022-04-01 NOTE — ED Provider Notes (Signed)
?[facility] ? ? ? ?CSN: [id number] ?Arrival date & time: 04/01/22  1935 ? ? ?  ? ?History   ?Chief Complaint ?Chief Complaint  ?Patient presents with  ? Insect Bite  ? ? ?HPI ?Katelyn Ellis is a 41 y.o. female presenting for an area of swelling, redness and pruritus of the right upper arm for the past 6-1/2 hours.  Patient says she was bit or stung by something.  She says she felt a sharp stinging and then she shook her arm.  She says she did not see anything, but says it felt like she was stung by something.  After that she developed the redness, swelling and itching in the area.  She has applied ice.  Reports that is a little painful.  It has increased in swelling.  Patient says she cannot take Benadryl because she gets hives when she takes it.  She has not taken any other antihistamines but can take Claritin.  She reports a spider bite years ago and says the end of getting infected and had to have the area cut open and she is worried about that happening again.  Presently she has no other areas of rash.  Not reporting any facial swelling, rash or breathing trouble.  No other complaints. ? ?HPI ? ?Past Medical History:  ?Diagnosis Date  ? Anemia   ? GERD (gastroesophageal reflux disease)   ? Headache   ? ? ?There are no problems to display for this patient. ? ? ?Past Surgical History:  ?Procedure Laterality Date  ? CHOLECYSTECTOMY N/A 08/12/2015  ? Procedure: LAPAROSCOPIC CHOLECYSTECTOMY WITH INTRAOPERATIVE CHOLANGIOGRAM;  Surgeon: Nadeen LandauJarvis Wilton Smith, MD;  Location: [facility] ORS;  Service: General;  Laterality: N/A;  ? NO PAST SURGERIES    ? ? ?OB History   ?No obstetric history on file. ?  ? ? ? ?Home Medications   ? ?Prior to Admission medications   ?Medication Sig Start Date End Date Taking? Authorizing Provider  ?FLUoxetine (PROZAC) 20 MG capsule Take 20 mg by mouth daily. 03/31/22  Yes [provider]  ?predniSONE (DELTASONE) 20 MG tablet Take 2 tablets (40 mg total) by mouth daily for 3  days. 04/01/22 04/04/22 Yes Eusebio FriendlyEaves, Suheily Birks B, PA-C  ?albuterol (VENTOLIN HFA) 108 (90 Base) MCG/ACT inhaler Inhale 2 puffs into the lungs every 4 (four) hours as needed. 12/27/20   Becky Augustayan, Jeremy, NP  ?benzonatate (TESSALON) 100 MG capsule Take 2 capsules (200 mg total) by mouth every 8 (eight) hours. 12/27/20   Becky Augustayan, Jeremy, NP  ?promethazine-dextromethorphan (PROMETHAZINE-DM) 6.25-15 MG/5ML syrup Take 5 mLs by mouth 4 (four) times daily as needed. 12/27/20   Becky Augustayan, Jeremy, NP  ?Spacer/Aero-Holding Chambers (AEROCHAMBER MV) inhaler Use as instructed 12/27/20   Becky Augustayan, Jeremy, NP  ? ? ?Family History ?Family History  ?Problem Relation Age of Onset  ? Esophagitis Mother   ? Diabetes Father   ? Pancreatic disease Father   ? Heart disease Father   ? ? ?Social History ?Social History  ? ?Tobacco Use  ? Smoking status: Never  ? Smokeless tobacco: Never  ?Vaping Use  ? Vaping Use: Never used  ?Substance Use Topics  ? Alcohol use: No  ? Drug use: No  ? ? ? ?Allergies   ?Benadryl [diphenhydramine hcl (sleep)], Codeine, Penicillins, and Zithromax [azithromycin] ? ? ?Review of Systems ?Review of Systems  ?Constitutional:  Negative for fatigue and fever.  ?Musculoskeletal:  Negative for arthralgias and joint swelling.  ?Skin:  Positive for color change and rash.  Negative for wound.  ? ? ?Physical Exam ?Triage Vital Signs ?ED Triage Vitals  ?Enc Vitals Group  ?   BP   ?   Pulse   ?   Resp   ?   Temp   ?   Temp src   ?   SpO2   ?   Weight   ?   Height   ?   Head Circumference   ?   Peak Flow   ?   Pain Score   ?   Pain Loc   ?   Pain Edu?   ?   Excl. in GC?   ? ?No data found. ? ?Updated Vital Signs ?BP 128/85 (BP Location: Left Arm)   Pulse 84   Temp 99 ?F (37.2 ?C) (Oral)   Resp 18   Ht 5\' 4"  (1.626 m)   Wt 204 lb (92.5 kg)   LMP 04/01/2022 (Approximate)   SpO2 100%   BMI 35.02 kg/m?  ?   ? ?Physical Exam ?Vitals and nursing note reviewed.  ?Constitutional:   ?   General: She is not in acute distress. ?   Appearance: Normal  appearance. She is not ill-appearing or toxic-appearing.  ?HENT:  ?   Head: Normocephalic and atraumatic.  ?   Nose: Nose normal.  ?   Mouth/Throat:  ?   Mouth: Mucous membranes are moist.  ?   Pharynx: Oropharynx is clear.  ?Eyes:  ?   General: No scleral icterus.    ?   Right eye: No discharge.     ?   Left eye: No discharge.  ?   Conjunctiva/sclera: Conjunctivae normal.  ?Cardiovascular:  ?   Rate and Rhythm: Normal rate and regular rhythm.  ?   Heart sounds: Normal heart sounds.  ?Pulmonary:  ?   Effort: Pulmonary effort is normal. No respiratory distress.  ?   Breath sounds: Normal breath sounds.  ?Musculoskeletal:  ?   Cervical back: Neck supple.  ?Skin: ?   General: Skin is dry.  ?   Findings: Rash (5 cm x 4 cm area of erythema/warmth/swelling and TTP right upper arm) present.  ?Neurological:  ?   General: No focal deficit present.  ?   Mental Status: She is alert. Mental status is at baseline.  ?   Motor: No weakness.  ?   Gait: Gait normal.  ?Psychiatric:     ?   Mood and Affect: Mood normal.     ?   Behavior: Behavior normal.     ?   Thought Content: Thought content normal.  ? ? ? ?UC Treatments / Results  ?Labs ?(all labs ordered are listed, but only abnormal results are displayed) ?Labs Reviewed - No data to display ? ?EKG ? ? ?Radiology ?No results found. ? ?Procedures ?Procedures (including critical care time) ? ?Medications Ordered in UC ?Medications - No data to display ? ?Initial Impression / Assessment and Plan / UC Course  ?I have reviewed the triage vital signs and the nursing notes. ? ?Pertinent labs & imaging results that were available during my care of the patient were reviewed by me and considered in my medical decision making (see chart for details). ? ?41 year old female presenting for localized allergic reaction that started 6 and half hours ago.  Has applied ice.  No other areas of rash, swelling and no reports of breathing difficulty.  Patient did not see what bit or stung her.  Advised  her to continue icing  the area and elevate it.  Sent a couple days of prednisone to help with the swelling.  Advised Claritin.  Reviewed return and ER precautions. ? ? ?Final Clinical Impressions(s) / UC Diagnoses  ? ?Final diagnoses:  ?Arm swelling  ?Insect bite of right upper arm, initial encounter  ? ? ? ?Discharge Instructions   ? ?  ?-You have allergic reaction that is very localized to some sort of insect bite or sting. ?- Ice the area and keep it elevated.  Take over-the-counter Claritin as long as you do not have an allergy to them.  I have sent a couple days of prednisone to help with swelling. ?- If the swelling worsens or you develop increased pain or have a fever or pustular drainage from the area you need to be seen again. ? ? ? ? ?ED Prescriptions   ? ? Medication Sig Dispense Auth. Provider  ? predniSONE (DELTASONE) 20 MG tablet Take 2 tablets (40 mg total) by mouth daily for 3 days. 6 tablet Shirlee Latch, PA-C  ? ?  ? ?PDMP not reviewed this encounter. ?  ?Shirlee Latch, PA-C ?04/01/22 2003 ? ?

## 2022-04-07 ENCOUNTER — Ambulatory Visit: Admit: 2022-04-07 | Payer: BC Managed Care – PPO

## 2022-11-24 ENCOUNTER — Ambulatory Visit
Admission: EM | Admit: 2022-11-24 | Discharge: 2022-11-24 | Disposition: A | Discharge status: Home / Self Care | Payer: BC Managed Care – PPO | Attending: Urgent Care | Admitting: Urgent Care

## 2022-11-24 DIAGNOSIS — R6889 Other general symptoms and signs: Secondary | ICD-10-CM | POA: Insufficient documentation

## 2022-11-24 DIAGNOSIS — J101 Influenza due to other identified influenza virus with other respiratory manifestations: Secondary | ICD-10-CM | POA: Diagnosis not present

## 2022-11-24 DIAGNOSIS — Z1152 Encounter for screening for COVID-19: Secondary | ICD-10-CM | POA: Diagnosis not present

## 2022-11-24 LAB — RESP PANEL BY RT-PCR (RSV, FLU A&B, COVID)  RVPGX2
Influenza A by PCR: POSITIVE — AB
Influenza B by PCR: NEGATIVE
Resp Syncytial Virus by PCR: NEGATIVE
SARS Coronavirus 2 by RT PCR: NEGATIVE

## 2022-11-24 MED ORDER — HYDROCOD POLI-CHLORPHE POLI ER 10-8 MG/5ML PO SUER: 5.0000 mL | Freq: Two times a day (BID) | ORAL | 0 refills | Status: AC | PRN

## 2022-11-24 MED ORDER — OSELTAMIVIR PHOSPHATE 75 MG PO CAPS: 75.0000 mg | ORAL_CAPSULE | Freq: Two times a day (BID) | ORAL | 0 refills | Status: DC

## 2022-11-24 MED ORDER — BENZONATATE 100 MG PO CAPS: ORAL_CAPSULE | ORAL | 0 refills | Status: DC

## 2022-11-24 NOTE — ED Provider Notes (Signed)
Katelyn Ellis    CSN: [id number] Arrival date & time: 11/24/22  1601      History   Chief Complaint Chief Complaint  Patient presents with   Chills    Think have pneumonia - Entered by patient    HPI Katelyn Ellis is a 41 y.o. female.   HPI  Presents to urgent care with complaint of chills, body aches, fever since yesterday.  Patient is concerned for pneumonia.  Past Medical History:  Diagnosis Date   Anemia    GERD (gastroesophageal reflux disease)    Headache     There are no problems to display for this patient.   Past Surgical History:  Procedure Laterality Date   CHOLECYSTECTOMY N/A 08/12/2015   Procedure: LAPAROSCOPIC CHOLECYSTECTOMY WITH INTRAOPERATIVE CHOLANGIOGRAM;  Surgeon: Leonie Green, MD;  Location: [facility] ORS;  Service: General;  Laterality: N/A;   NO PAST SURGERIES      OB History   No obstetric history on file.      Home Medications    Prior to Admission medications   Medication Sig Start Date End Date Taking? Authorizing Provider  albuterol (VENTOLIN HFA) 108 (90 Base) MCG/ACT inhaler Inhale 2 puffs into the lungs every 4 (four) hours as needed. 12/27/20   Margarette Canada, NP  benzonatate (TESSALON) 100 MG capsule Take 2 capsules (200 mg total) by mouth every 8 (eight) hours. 12/27/20   Margarette Canada, NP  FLUoxetine (PROZAC) 20 MG capsule Take 20 mg by mouth daily. 03/31/22   [provider]  promethazine-dextromethorphan (PROMETHAZINE-DM) 6.25-15 MG/5ML syrup Take 5 mLs by mouth 4 (four) times daily as needed. 12/27/20   Margarette Canada, NP  Spacer/Aero-Holding Josiah Lobo (AEROCHAMBER MV) inhaler Use as instructed 12/27/20   Margarette Canada, NP    Family History Family History  Problem Relation Age of Onset   Esophagitis Mother    Diabetes Father    Pancreatic disease Father    Heart disease Father     Social History Social History   Tobacco Use   Smoking status: Never   Smokeless tobacco: Never  Vaping Use   Vaping  Use: Never used  Substance Use Topics   Alcohol use: No   Drug use: No     Allergies   Benadryl [diphenhydramine hcl (sleep)], Codeine, Penicillins, and Zithromax [azithromycin]   Review of Systems Review of Systems   Physical Exam Triage Vital Signs ED Triage Vitals  Enc Vitals Group     BP      Pulse      Resp      Temp      Temp src      SpO2      Weight      Height      Head Circumference      Peak Flow      Pain Score      Pain Loc      Pain Edu?      Excl. in [place]?    No data found.  Updated Vital Signs There were no vitals taken for this visit.  Visual Acuity Right Eye Distance:   Left Eye Distance:   Bilateral Distance:    Right Eye Near:   Left Eye Near:    Bilateral Near:     Physical Exam Vitals reviewed.  Constitutional:      Appearance: She is ill-appearing.  HENT:     Mouth/Throat:     Pharynx: No oropharyngeal exudate or posterior oropharyngeal erythema.  Cardiovascular:  Rate and Rhythm: Normal rate and regular rhythm.     Pulses: Normal pulses.     Heart sounds: Normal heart sounds.  Pulmonary:     Breath sounds: Normal breath sounds.  Skin:    General: Skin is warm and dry.  Neurological:     General: No focal deficit present.     Mental Status: She is alert and oriented to person, place, and time.  Psychiatric:        Mood and Affect: Mood normal.        Behavior: Behavior normal.      UC Treatments / Results  Labs (all labs ordered are listed, but only abnormal results are displayed) Labs Reviewed - No data to display  EKG   Radiology No results found.  Procedures Procedures (including critical care time)  Medications Ordered in UC Medications - No data to display  Initial Impression / Assessment and Plan / UC Course  I have reviewed the triage vital signs and the nursing notes.  Pertinent labs & imaging results that were available during my care of the patient were reviewed by me and considered in my  medical decision making (see chart for details).   Patient is afebrile here without recent antipyretics. Satting well on room air. Overall is ill appearing, though well hydrated and without respiratory distress. Pulmonary exam is unremarkable.   Viral process is likely as the cause of her symptoms, possibly flu or COVID.  Prescribing Tamiflu whilst awaiting results of her respiratory swab.  Also recommend continuing use of OTC medication for control of other symptoms.  Final Clinical Impressions(s) / UC Diagnoses   Final diagnoses:  None   Discharge Instructions   None    ED Prescriptions   None    PDMP not reviewed this encounter.   Charma Igo, [place] 11/24/22 1721

## 2022-11-24 NOTE — ED Triage Notes (Signed)
Pt. Presents to UC w/ c/o chills, body aches and a fever since yesterday.

## 2022-11-24 NOTE — Discharge Instructions (Signed)
You have been diagnosed with a viral upper respiratory infection based on your symptoms and exam. Viral illnesses cannot be treated with antibiotics - they are self limiting - and you should find your symptoms resolving within a few days. Get plenty of rest and non-caffeinated fluids.  We have performed a respiratory swab checking for COVID, and influenza RSV.  You were treated with antiviral therapy for influenza A based on a presumptive diagnosis.  Someone will call you once results are available with instructions to continue or discontinue this treatment.    We recommend you use over-the-counter medications for symptom control including Tylenol or ibuprofen for fever, chills or body aches, and cold/cough medication.  I have prescribed 2 cough medications: 1 for daytime use, and 1 for nighttime use.   Saline mist spray is helpful for removing excess mucus from your nose.  Room humidifiers are helpful to ease breathing at night. You might also find relief of nasal/sinus congestion symptoms by using a nasal decongestant such as Sudafed sinus (pseudoephedrine).  You will need to obtain this medication from behind the pharmacist counter.  Speak to the pharmacist to verify that you are not duplicating medications with other over-the-counter formulations that you may be using.   Follow up here or with your primary care provider if your symptoms are worsening or not improving.

## 2024-09-19 ENCOUNTER — Ambulatory Visit
Admission: RE | Admit: 2024-09-19 | Discharge: 2024-09-19 | Disposition: A | Discharge status: Home / Self Care | Source: Ambulatory Visit | Attending: Emergency Medicine | Admitting: Emergency Medicine

## 2024-09-19 VITALS — BP 103/77 | HR 134 | Temp 100.4°F | Resp 18

## 2024-09-19 DIAGNOSIS — J09X2 Influenza due to identified novel influenza A virus with other respiratory manifestations: Secondary | ICD-10-CM | POA: Diagnosis not present

## 2024-09-19 DIAGNOSIS — R6889 Other general symptoms and signs: Secondary | ICD-10-CM | POA: Diagnosis present

## 2024-09-19 LAB — RESP PANEL BY RT-PCR (FLU A&B, COVID) ARPGX2
Influenza A by PCR: POSITIVE — AB
Influenza B by PCR: NEGATIVE
SARS Coronavirus 2 by RT PCR: NEGATIVE

## 2024-09-19 LAB — GROUP A STREP BY PCR: Group A Strep by PCR: NOT DETECTED

## 2024-09-19 MED ORDER — PROMETHAZINE-DM 6.25-15 MG/5ML PO SYRP: 5.0000 mL | ORAL_SOLUTION | Freq: Four times a day (QID) | ORAL | 0 refills | Status: AC | PRN

## 2024-09-19 MED ORDER — ALBUTEROL SULFATE HFA 108 (90 BASE) MCG/ACT IN AERS: 1.0000 | INHALATION_SPRAY | RESPIRATORY_TRACT | 0 refills | Status: AC | PRN

## 2024-09-19 MED ORDER — BENZONATATE 100 MG PO CAPS: ORAL_CAPSULE | ORAL | 0 refills | Status: AC

## 2024-09-19 MED ORDER — IPRATROPIUM BROMIDE 0.06 % NA SOLN: 2.0000 | Freq: Four times a day (QID) | NASAL | 12 refills | Status: AC

## 2024-09-19 MED ORDER — ACETAMINOPHEN 160 MG/5ML PO SOLN
975.0000 mg | Freq: Once | ORAL | Status: AC
  Administered 2024-09-19: 975 mg via ORAL

## 2024-09-19 MED ORDER — OSELTAMIVIR PHOSPHATE 75 MG PO CAPS: 75.0000 mg | ORAL_CAPSULE | Freq: Two times a day (BID) | ORAL | 0 refills | Status: AC

## 2024-09-19 NOTE — ED Triage Notes (Signed)
 Sore throat, cough, headache, body aches, fever 101.6, fatigue, chills, x 1 day. Have not took any OTC medication at this time.  Took a home covid test yesterday and it was negative.

## 2024-09-19 NOTE — ED Provider Notes (Signed)
 [facility]    CSN: [id number] Arrival date & time: 09/19/24  0846      History   Chief Complaint Chief Complaint  Patient presents with   Fever    I have a fever of 101.6, chills, body aches, sore throat, dizzy, cold childs, cough, tired. took home covid test last night was negative - Entered by patient   Cough    HPI Katelyn Ellis is a 43 y.o. female.   HPI  43 year old female with past medical head significant for headache, GERD, and anemia presents for evaluation of flulike symptoms that began yesterday.  She is reporting a fever of 101.6 has a Tmax, fatigue, headache, body aches, chills, sore throat, ear pain, nonproductive cough, and chest tightness.  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  No shortness of breath, wheezing, or travel out of state or country.  Past Medical History:  Diagnosis Date   Anemia    GERD (gastroesophageal reflux disease)    Headache     There are no active problems to display for this patient.   Past Surgical History:  Procedure Laterality Date   CHOLECYSTECTOMY N/A 08/12/2015   Procedure: LAPAROSCOPIC CHOLECYSTECTOMY WITH INTRAOPERATIVE CHOLANGIOGRAM;  Surgeon: Larinda Unknown Sharps, MD;  Location: [facility];  Service: General;  Laterality: N/A;   NO PAST SURGERIES      OB History   No obstetric history on file.      Home Medications    Prior to Admission medications   Medication Sig Start Date End Date Taking? Authorizing Provider  FLUoxetine (PROZAC) 20 MG capsule Take 20 mg by mouth daily. 03/31/22  Yes [provider]  ipratropium (ATROVENT) 0.06 % nasal spray Place 2 sprays into both nostrils 4 (four) times daily. 09/19/24  Yes Bernardino Ditch, NP  promethazine -dextromethorphan (PROMETHAZINE -DM) 6.25-15 MG/5ML syrup Take 5 mLs by mouth 4 (four) times daily as needed. 09/19/24  Yes Bernardino Ditch, NP  albuterol  (VENTOLIN  HFA) 108 (90 Base) MCG/ACT inhaler Inhale 1-2 puffs  into the lungs every 4 (four) hours as needed. 09/19/24   Bernardino Ditch, NP  benzonatate  (TESSALON ) 100 MG capsule Take 1-2 tablets 3 times a day as needed for cough 09/19/24   Bernardino Ditch, NP  oseltamivir  (TAMIFLU ) 75 MG capsule Take 1 capsule (75 mg total) by mouth every 12 (twelve) hours. 09/19/24   Bernardino Ditch, NP  Spacer/Aero-Holding Raguel (AEROCHAMBER MV) inhaler Use as instructed 12/27/20   Bernardino Ditch, NP    Family History Family History  Problem Relation Age of Onset   Esophagitis Mother    Diabetes Father    Pancreatic disease Father    Heart disease Father     Social History Social History   Tobacco Use   Smoking status: Never   Smokeless tobacco: Never  Vaping Use   Vaping status: Never Used  Substance Use Topics   Alcohol use: No   Drug use: No     Allergies   Benadryl [diphenhydramine hcl (sleep)], Codeine, Penicillins, and Zithromax [azithromycin]   Review of Systems Review of Systems  Constitutional:  Positive for chills, fatigue and fever.  HENT:  Positive for congestion, ear pain, rhinorrhea and sore throat.   Respiratory:  Positive for cough and chest tightness. Negative for shortness of breath and wheezing.   Musculoskeletal:  Positive for arthralgias and myalgias.  Neurological:  Positive for headaches.     Physical Exam Triage Vital Signs ED Triage Vitals  Encounter  Vitals Group     BP      Girls Systolic BP Percentile      Girls Diastolic BP Percentile      Boys Systolic BP Percentile      Boys Diastolic BP Percentile      Pulse      Resp      Temp      Temp src      SpO2      Weight      Height      Head Circumference      Peak Flow      Pain Score      Pain Loc      Pain Education      Exclude from Growth Chart    No data found.  Updated Vital Signs BP 103/77 (BP Location: Right Arm)   Pulse (!) 134   Temp (!) 100.4 F (38 C) (Oral)   Resp 18   LMP 09/10/2024 (Approximate)   SpO2 96%   Visual Acuity Right Eye  Distance:   Left Eye Distance:   Bilateral Distance:    Right Eye Near:   Left Eye Near:    Bilateral Near:     Physical Exam Vitals and nursing note reviewed.  Constitutional:      Appearance: She is ill-appearing.  HENT:     Head: Normocephalic and atraumatic.     Right Ear: Tympanic membrane, ear canal and external ear normal. There is no impacted cerumen.     Left Ear: Tympanic membrane, ear canal and external ear normal. There is no impacted cerumen.     Nose: Congestion present. No rhinorrhea.     Comments: Nasal mucosa is erythematous and mildly edematous without appreciable discharge.    Mouth/Throat:     Mouth: Mucous membranes are moist.     Pharynx: Oropharynx is clear. Posterior oropharyngeal erythema present. No oropharyngeal exudate.     Comments: Erythema to the soft palate without appreciable erythema or edema of the tonsillar pillars.  No visible exudate. Neck:     Comments: Bilateral anterior, tender, cervical lymphadenopathy present. Cardiovascular:     Rate and Rhythm: Normal rate and regular rhythm.     Pulses: Normal pulses.     Heart sounds: Normal heart sounds. No murmur heard.    No friction rub. No gallop.  Pulmonary:     Effort: Pulmonary effort is normal.     Breath sounds: Normal breath sounds. No wheezing, rhonchi or rales.  Musculoskeletal:     Cervical back: Normal range of motion and neck supple. Tenderness present.  Lymphadenopathy:     Cervical: Cervical adenopathy present.  Skin:    General: Skin is warm and dry.     Capillary Refill: Capillary refill takes less than 2 seconds.     Findings: No rash.  Neurological:     General: No focal deficit present.     Mental Status: She is alert and oriented to person, place, and time.      UC Treatments / Results  Labs (all labs ordered are listed, but only abnormal results are displayed) Labs Reviewed  RESP PANEL BY RT-PCR (FLU A&B, COVID) ARPGX2 - Abnormal; Notable for the following  components:      Result Value   Influenza A by PCR POSITIVE (*)    All other components within normal limits  GROUP A STREP BY PCR    EKG   Radiology No results found.  Procedures Procedures (including critical care  time)  Medications Ordered in UC Medications  acetaminophen  (TYLENOL ) 160 MG/5ML solution 975 mg (975 mg Oral Given 09/19/24 0938)    Initial Impression / Assessment and Plan / UC Course  I have reviewed the triage vital signs and the nursing notes.  Pertinent labs & imaging results that were available during my care of the patient were reviewed by me and considered in my medical decision making (see chart for details).   Patient is a pleasant, though mildly ill-appearing, 43 year old female presenting for evaluation of 1 day worth of COVID/flulike symptoms as outlined in HPI above.  Her physical exam does reveal inflammation of her upper respiratory tract as evidenced by inflamed nasal mucosa and erythema to her soft palate.  No visible discharge noted and no appreciable tonsillar hypertrophy or exudate.  Cervical lymphadenopathy is present.  Cardiopulmonary exam reveals clear lung sounds in all fields.  Differential diagnose include COVID, influenza, strep pharyngitis, viral illness.  I will order a COVID and flu PCR as well as a strep PCR.  She is currently febrile with an oral temp of 100.4.  I will order 975 mg of Tylenol .  Patient is requesting liquid rather than pills.  Strep PCR is negative.  Respiratory panel is positive for influenza A.  I will discharge patient home with a diagnosis of flu A and start her on Tamiflu  75 mg twice daily for 5 days.  She may use over-the-counter Tylenol  and/or ibuprofen  as needed for any fever or pain.  Atrovent nasal spray for the nasal congestion and Tessalon  Perles and Promethazine  DM cough syrup for cough and congestion.  She is endorsing chest tightness so I will prescribe an albuterol  inhaler and spacer that she may use every  4-6 hours.   Final Clinical Impressions(s) / UC Diagnoses   Final diagnoses:  Influenza due to identified novel influenza A virus with other respiratory manifestations     Discharge Instructions      Take the Tamiflu  twice daily for 5 days for treatment of influenza.  Use the albuterol  inhaler, with the spacer, and take 1 to 2 puffs every 4-6 hours as needed for any shortness of breath or wheezing.  Use the Atrovent nasal spray, 2 squirts up each nostril every 6 hours, as needed for nasal congestion and runny nose.  Use over-the-counter Delsym, Zarbee's, or Robitussin during the day as needed for cough.  Use the Tessalon  Perles every 8 hours as needed for cough.  Taken with a small sip of water.  You may experience some numbness to your tongue or metallic taste in your mouth, this is normal.  Use the Promethazine  DM cough syrup at bedtime as will make you drowsy but it should help dry up your postnasal drip and aid you in sleep and cough relief.  Return for reevaluation, or see your primary care provider, for new or worsening symptoms.      ED Prescriptions     Medication Sig Dispense Auth. Provider   albuterol  (VENTOLIN  HFA) 108 (90 Base) MCG/ACT inhaler Inhale 1-2 puffs into the lungs every 4 (four) hours as needed. 18 g Bernardino Ditch, NP   benzonatate  (TESSALON ) 100 MG capsule Take 1-2 tablets 3 times a day as needed for cough 30 capsule Bernardino Ditch, NP   oseltamivir  (TAMIFLU ) 75 MG capsule Take 1 capsule (75 mg total) by mouth every 12 (twelve) hours. 10 capsule Bernardino Ditch, NP   ipratropium (ATROVENT) 0.06 % nasal spray Place 2 sprays into both nostrils 4 (four) times  daily. 15 mL Bernardino Ditch, NP   promethazine -dextromethorphan (PROMETHAZINE -DM) 6.25-15 MG/5ML syrup Take 5 mLs by mouth 4 (four) times daily as needed. 118 mL Bernardino Ditch, NP      PDMP not reviewed this encounter.   Bernardino Ditch, NP 09/19/24 1110

## 2024-09-19 NOTE — Discharge Instructions (Addendum)
 Take the Tamiflu  twice daily for 5 days for treatment of influenza.  Use the albuterol  inhaler, with the spacer, and take 1 to 2 puffs every 4-6 hours as needed for any shortness of breath or wheezing.  Use the Atrovent nasal spray, 2 squirts up each nostril every 6 hours, as needed for nasal congestion and runny nose.  Use over-the-counter Delsym, Zarbee's, or Robitussin during the day as needed for cough.  Use the Tessalon  Perles every 8 hours as needed for cough.  Taken with a small sip of water.  You may experience some numbness to your tongue or metallic taste in your mouth, this is normal.  Use the Promethazine  DM cough syrup at bedtime as will make you drowsy but it should help dry up your postnasal drip and aid you in sleep and cough relief.  Return for reevaluation, or see your primary care provider, for new or worsening symptoms.
